# Patient Record
Sex: Female | Born: 1955 | Race: White | Hispanic: No | Marital: Married | Smoking: Never smoker
Health system: Southern US, Community
[De-identification: ages and names within clinical notes are randomized; demographics above are authoritative.]

## PROBLEM LIST (undated history)

## (undated) DIAGNOSIS — K759 Inflammatory liver disease, unspecified: Secondary | ICD-10-CM

## (undated) DIAGNOSIS — K219 Gastro-esophageal reflux disease without esophagitis: Secondary | ICD-10-CM

## (undated) HISTORY — PX: COLONOSCOPY: SHX5424

## (undated) HISTORY — PX: TONSILLECTOMY: SUR1361

---

## 2004-03-04 HISTORY — PX: LIVER BIOPSY: SHX301

## 2004-09-18 ENCOUNTER — Ambulatory Visit: Payer: Self-pay | Admitting: Internal Medicine

## 2004-12-22 ENCOUNTER — Ambulatory Visit: Payer: Self-pay | Admitting: Internal Medicine

## 2005-03-04 HISTORY — PX: OVARY SURGERY: SHX727

## 2005-10-08 ENCOUNTER — Ambulatory Visit: Payer: Self-pay | Admitting: Gastroenterology

## 2006-10-02 ENCOUNTER — Ambulatory Visit: Payer: Self-pay | Admitting: Unknown Physician Specialty

## 2006-10-09 ENCOUNTER — Ambulatory Visit: Payer: Self-pay | Admitting: Unknown Physician Specialty

## 2011-11-08 ENCOUNTER — Ambulatory Visit: Payer: Self-pay | Admitting: Internal Medicine

## 2012-11-26 ENCOUNTER — Ambulatory Visit: Payer: Self-pay | Admitting: Family Medicine

## 2013-11-05 ENCOUNTER — Emergency Department: Payer: Self-pay | Admitting: Emergency Medicine

## 2013-11-05 LAB — COMPREHENSIVE METABOLIC PANEL
ALT: 47 U/L
ANION GAP: 12 (ref 7–16)
AST: 47 U/L — AB (ref 15–37)
Albumin: 4.1 g/dL (ref 3.4–5.0)
Alkaline Phosphatase: 59 U/L
BUN: 9 mg/dL (ref 7–18)
Bilirubin,Total: 0.7 mg/dL (ref 0.2–1.0)
CALCIUM: 9.2 mg/dL (ref 8.5–10.1)
CO2: 22 mmol/L (ref 21–32)
CREATININE: 0.69 mg/dL (ref 0.60–1.30)
Chloride: 110 mmol/L — ABNORMAL HIGH (ref 98–107)
GLUCOSE: 97 mg/dL (ref 65–99)
Osmolality: 285 (ref 275–301)
POTASSIUM: 4.1 mmol/L (ref 3.5–5.1)
SODIUM: 144 mmol/L (ref 136–145)
Total Protein: 7.7 g/dL (ref 6.4–8.2)

## 2013-11-05 LAB — URINALYSIS, COMPLETE
BILIRUBIN, UR: NEGATIVE
Bacteria: NONE SEEN
Blood: NEGATIVE
Glucose,UR: NEGATIVE mg/dL (ref 0–75)
KETONE: NEGATIVE
Nitrite: NEGATIVE
Ph: 6 (ref 4.5–8.0)
Protein: NEGATIVE
SPECIFIC GRAVITY: 1.005 (ref 1.003–1.030)
WBC UR: 3 /HPF (ref 0–5)

## 2013-11-05 LAB — CBC WITH DIFFERENTIAL/PLATELET
BASOS PCT: 1 %
Basophil #: 0.1 10*3/uL (ref 0.0–0.1)
EOS ABS: 0 10*3/uL (ref 0.0–0.7)
Eosinophil %: 0.7 %
HCT: 44.9 % (ref 35.0–47.0)
HGB: 14.7 g/dL (ref 12.0–16.0)
LYMPHS ABS: 1.1 10*3/uL (ref 1.0–3.6)
Lymphocyte %: 16.8 %
MCH: 28.9 pg (ref 26.0–34.0)
MCHC: 32.7 g/dL (ref 32.0–36.0)
MCV: 89 fL (ref 80–100)
Monocyte #: 0.5 x10 3/mm (ref 0.2–0.9)
Monocyte %: 7.4 %
Neutrophil #: 4.9 10*3/uL (ref 1.4–6.5)
Neutrophil %: 74.1 %
PLATELETS: 232 10*3/uL (ref 150–440)
RBC: 5.07 10*6/uL (ref 3.80–5.20)
RDW: 13.3 % (ref 11.5–14.5)
WBC: 6.6 10*3/uL (ref 3.6–11.0)

## 2013-11-05 LAB — LIPASE, BLOOD: Lipase: 215 U/L (ref 73–393)

## 2013-11-05 LAB — TROPONIN I: Troponin-I: <0.02 ng/mL

## 2013-12-08 ENCOUNTER — Ambulatory Visit: Payer: Self-pay | Admitting: Family Medicine

## 2014-10-17 ENCOUNTER — Other Ambulatory Visit: Payer: Self-pay | Admitting: Family Medicine

## 2014-10-17 DIAGNOSIS — Z1231 Encounter for screening mammogram for malignant neoplasm of breast: Secondary | ICD-10-CM

## 2014-12-12 ENCOUNTER — Ambulatory Visit
Admission: RE | Admit: 2014-12-12 | Discharge: 2014-12-12 | Disposition: A | Discharge status: Home / Self Care | Payer: BC Managed Care – PPO | Source: Ambulatory Visit | Attending: Family Medicine | Admitting: Family Medicine

## 2014-12-12 DIAGNOSIS — Z1231 Encounter for screening mammogram for malignant neoplasm of breast: Secondary | ICD-10-CM | POA: Diagnosis present

## 2015-10-11 ENCOUNTER — Other Ambulatory Visit: Payer: Self-pay | Admitting: Family Medicine

## 2015-10-11 DIAGNOSIS — Z1231 Encounter for screening mammogram for malignant neoplasm of breast: Secondary | ICD-10-CM

## 2015-12-13 ENCOUNTER — Other Ambulatory Visit: Payer: Self-pay | Admitting: Family Medicine

## 2015-12-13 ENCOUNTER — Ambulatory Visit
Admission: RE | Admit: 2015-12-13 | Discharge: 2015-12-13 | Disposition: A | Discharge status: Home / Self Care | Payer: BC Managed Care – PPO | Source: Ambulatory Visit | Attending: Family Medicine | Admitting: Family Medicine

## 2015-12-13 DIAGNOSIS — Z1231 Encounter for screening mammogram for malignant neoplasm of breast: Secondary | ICD-10-CM | POA: Diagnosis not present

## 2015-12-13 DIAGNOSIS — R921 Mammographic calcification found on diagnostic imaging of breast: Secondary | ICD-10-CM | POA: Diagnosis not present

## 2015-12-13 DIAGNOSIS — R928 Other abnormal and inconclusive findings on diagnostic imaging of breast: Secondary | ICD-10-CM | POA: Insufficient documentation

## 2015-12-15 ENCOUNTER — Other Ambulatory Visit: Payer: Self-pay | Admitting: Family Medicine

## 2015-12-15 DIAGNOSIS — R921 Mammographic calcification found on diagnostic imaging of breast: Secondary | ICD-10-CM

## 2015-12-15 DIAGNOSIS — R928 Other abnormal and inconclusive findings on diagnostic imaging of breast: Secondary | ICD-10-CM

## 2015-12-19 ENCOUNTER — Ambulatory Visit
Admission: RE | Admit: 2015-12-19 | Discharge: 2015-12-19 | Disposition: A | Discharge status: Home / Self Care | Payer: BC Managed Care – PPO | Source: Ambulatory Visit | Attending: Family Medicine | Admitting: Family Medicine

## 2015-12-19 DIAGNOSIS — R921 Mammographic calcification found on diagnostic imaging of breast: Secondary | ICD-10-CM | POA: Diagnosis present

## 2015-12-19 DIAGNOSIS — R928 Other abnormal and inconclusive findings on diagnostic imaging of breast: Secondary | ICD-10-CM

## 2015-12-20 ENCOUNTER — Other Ambulatory Visit: Payer: Self-pay | Admitting: Family Medicine

## 2015-12-20 DIAGNOSIS — R921 Mammographic calcification found on diagnostic imaging of breast: Secondary | ICD-10-CM

## 2015-12-27 ENCOUNTER — Ambulatory Visit
Admission: RE | Admit: 2015-12-27 | Discharge: 2015-12-27 | Disposition: A | Discharge status: Home / Self Care | Payer: BC Managed Care – PPO | Source: Ambulatory Visit | Attending: Family Medicine | Admitting: Family Medicine

## 2015-12-27 DIAGNOSIS — R921 Mammographic calcification found on diagnostic imaging of breast: Secondary | ICD-10-CM | POA: Diagnosis present

## 2015-12-27 DIAGNOSIS — D241 Benign neoplasm of right breast: Secondary | ICD-10-CM | POA: Insufficient documentation

## 2015-12-27 HISTORY — PX: BREAST BIOPSY: SHX20

## 2015-12-28 LAB — SURGICAL PATHOLOGY

## 2016-01-01 ENCOUNTER — Telehealth: Payer: Self-pay | Admitting: *Deleted

## 2016-01-01 NOTE — Telephone Encounter (Signed)
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  Per radiology recommendation  Patient will need a surgical consult.  Patient scheduled to see Dr. Rochel Brome on 01/22/16 @ 4:00.  Dr. Reuel Boom office notified.

## 2016-01-05 ENCOUNTER — Other Ambulatory Visit: Payer: Self-pay | Admitting: Surgery

## 2016-01-05 DIAGNOSIS — N63 Unspecified lump in unspecified breast: Secondary | ICD-10-CM

## 2016-01-11 ENCOUNTER — Encounter
Admission: RE | Admit: 2016-01-11 | Discharge: 2016-01-11 | Disposition: A | Discharge status: Home / Self Care | Payer: BC Managed Care – PPO | Source: Ambulatory Visit | Attending: Surgery | Admitting: Surgery

## 2016-01-11 HISTORY — DX: Inflammatory liver disease, unspecified: K75.9

## 2016-01-11 HISTORY — DX: Gastro-esophageal reflux disease without esophagitis: K21.9

## 2016-01-11 NOTE — Patient Instructions (Addendum)
  Your procedure is scheduled on: 01-18-16 Report to [facility] @ 8 AM   Remember: Instructions that are not followed completely may result in serious medical risk, up to and including death, or upon the discretion of your surgeon and anesthesiologist your surgery may need to be rescheduled.    _x___ 1. Do not eat food or drink liquids after midnight. No gum chewing or hard candies.     __x__ 2. No Alcohol for 24 hours before or after surgery.   __x__3. No Smoking for 24 prior to surgery.   ____  4. Bring all medications with you on the day of surgery if instructed.    __x__ 5. Notify your doctor if there is any change in your medical condition     (cold, fever, infections).     Do not wear jewelry, make-up, hairpins, clips or nail polish.  Do not wear lotions, powders, or perfumes. You may wear deodorant.  Do not shave 48 hours prior to surgery. Men may shave face and neck.  Do not bring valuables to the hospital.    [facility] is not responsible for any belongings or valuables.               Contacts, dentures or bridgework may not be worn into surgery.  Leave your suitcase in the car. After surgery it may be brought to your room.  For patients admitted to the hospital, discharge time is determined by your treatment team.   Patients discharged the day of surgery will not be allowed to drive home.    Please read over the following fact sheets that you were given:   [facility] Preparing for Surgery and or MRSA Information   ____ Take these medicines the morning of surgery with A SIP OF WATER:    1. DO NOT TAKE CELLCEPT THE MORNING OF SURGERY PER DR SMITH  2.  3.  4.  5.  6.  ____Fleets enema or Magnesium Citrate as directed.   ____ Use CHG Soap or sage wipes as directed on instruction sheet   ____ Use inhalers on the day of surgery and bring to hospital day of surgery  ____ Stop metformin 2 days prior to surgery    ____ Take 1/2 of usual insulin dose the  night before surgery and none on the morning of   surgery.   ____ Stop aspirin or coumadin, or plavix  x__ Stop Anti-inflammatories such as Advil, Aleve, Ibuprofen, Motrin, Naproxen,          Naprosyn, Goodies powders or aspirin products. Ok to take Tylenol.   ____ Stop supplements until after surgery.    ____ Bring C-Pap to the hospital.

## 2016-01-18 ENCOUNTER — Encounter
Admission: RE | Disposition: A | Discharge status: Home / Self Care | Payer: Self-pay | Source: Ambulatory Visit | Attending: Surgery

## 2016-01-18 ENCOUNTER — Encounter: Payer: Self-pay | Admitting: *Deleted

## 2016-01-18 ENCOUNTER — Ambulatory Visit: Payer: BC Managed Care – PPO | Admitting: Anesthesiology

## 2016-01-18 ENCOUNTER — Ambulatory Visit
Admission: RE | Admit: 2016-01-18 | Discharge: 2016-01-18 | Disposition: A | Discharge status: Home / Self Care | Payer: BC Managed Care – PPO | Source: Ambulatory Visit | Attending: Surgery | Admitting: Surgery

## 2016-01-18 DIAGNOSIS — D241 Benign neoplasm of right breast: Secondary | ICD-10-CM | POA: Diagnosis not present

## 2016-01-18 DIAGNOSIS — K219 Gastro-esophageal reflux disease without esophagitis: Secondary | ICD-10-CM | POA: Diagnosis not present

## 2016-01-18 DIAGNOSIS — N63 Unspecified lump in unspecified breast: Secondary | ICD-10-CM

## 2016-01-18 DIAGNOSIS — N6310 Unspecified lump in the right breast, unspecified quadrant: Secondary | ICD-10-CM | POA: Insufficient documentation

## 2016-01-18 HISTORY — PX: BREAST EXCISIONAL BIOPSY: SUR124

## 2016-01-18 HISTORY — PX: BREAST LUMPECTOMY WITH NEEDLE LOCALIZATION: SHX5759

## 2016-01-18 SURGERY — BREAST LUMPECTOMY WITH NEEDLE LOCALIZATION
Anesthesia: General | Laterality: Right | Wound class: Clean Contaminated

## 2016-01-18 MED ORDER — ONDANSETRON HCL 4 MG/2ML IJ SOLN
INTRAMUSCULAR | Status: DC | PRN
  Administered 2016-01-18: 4 mg via INTRAVENOUS

## 2016-01-18 MED ORDER — LIDOCAINE HCL (CARDIAC) 20 MG/ML IV SOLN
INTRAVENOUS | Status: DC | PRN
  Administered 2016-01-18: 50 mg via INTRAVENOUS

## 2016-01-18 MED ORDER — HYDROCODONE-ACETAMINOPHEN 5-325 MG PO TABS: 1.0000 | ORAL_TABLET | ORAL | 0 refills | Status: AC | PRN

## 2016-01-18 MED ORDER — FAMOTIDINE 20 MG PO TABS
ORAL_TABLET | ORAL | Status: AC
  Administered 2016-01-18: 20 mg via ORAL
  Filled 2016-01-18: qty 1

## 2016-01-18 MED ORDER — HYDROCODONE-ACETAMINOPHEN 5-325 MG PO TABS
ORAL_TABLET | ORAL | Status: AC
  Filled 2016-01-18: qty 1

## 2016-01-18 MED ORDER — BUPIVACAINE-EPINEPHRINE (PF) 0.5% -1:200000 IJ SOLN
INTRAMUSCULAR | Status: AC
  Filled 2016-01-18: qty 30

## 2016-01-18 MED ORDER — MIDAZOLAM HCL 2 MG/2ML IJ SOLN
INTRAMUSCULAR | Status: DC | PRN
Start: 2016-01-18 — End: 2016-01-18
  Administered 2016-01-18: 2 mg via INTRAVENOUS

## 2016-01-18 MED ORDER — ONDANSETRON HCL 4 MG/2ML IJ SOLN: 4.0000 mg | Freq: Once | INTRAMUSCULAR | Status: DC | PRN

## 2016-01-18 MED ORDER — HYDROCODONE-ACETAMINOPHEN 5-325 MG PO TABS
1.0000 | ORAL_TABLET | ORAL | Status: DC | PRN
  Administered 2016-01-18: 1 via ORAL

## 2016-01-18 MED ORDER — PROPOFOL 10 MG/ML IV BOLUS
INTRAVENOUS | Status: DC | PRN
  Administered 2016-01-18: 200 mg via INTRAVENOUS

## 2016-01-18 MED ORDER — FENTANYL CITRATE (PF) 100 MCG/2ML IJ SOLN: 25.0000 ug | INTRAMUSCULAR | Status: DC | PRN

## 2016-01-18 MED ORDER — FAMOTIDINE 20 MG PO TABS
20.0000 mg | ORAL_TABLET | Freq: Once | ORAL | Status: AC
  Administered 2016-01-18: 20 mg via ORAL

## 2016-01-18 MED ORDER — LACTATED RINGERS IV SOLN
INTRAVENOUS | Status: DC
  Administered 2016-01-18: 10:00:00 via INTRAVENOUS

## 2016-01-18 MED ORDER — GLYCOPYRROLATE 0.2 MG/ML IJ SOLN
INTRAMUSCULAR | Status: DC | PRN
  Administered 2016-01-18: 0.2 mg via INTRAVENOUS

## 2016-01-18 MED ORDER — FENTANYL CITRATE (PF) 100 MCG/2ML IJ SOLN
INTRAMUSCULAR | Status: DC | PRN
  Administered 2016-01-18 (×2): 50 ug via INTRAVENOUS

## 2016-01-18 MED ORDER — EPHEDRINE SULFATE 50 MG/ML IJ SOLN
INTRAMUSCULAR | Status: DC | PRN
  Administered 2016-01-18: 15 mg via INTRAVENOUS

## 2016-01-18 MED ORDER — DEXAMETHASONE SODIUM PHOSPHATE 10 MG/ML IJ SOLN
INTRAMUSCULAR | Status: DC | PRN
  Administered 2016-01-18: 4 mg via INTRAVENOUS

## 2016-01-18 MED ORDER — BUPIVACAINE-EPINEPHRINE (PF) 0.5% -1:200000 IJ SOLN
INTRAMUSCULAR | Status: DC | PRN
  Administered 2016-01-18: 10 mL via PERINEURAL

## 2016-01-18 MED ORDER — LACTATED RINGERS IV SOLN
INTRAVENOUS | Status: DC | PRN
  Administered 2016-01-18: 10:00:00 via INTRAVENOUS

## 2016-01-18 SURGICAL SUPPLY — 28 items
BLADE SURG 15 STRL LF DISP TIS (BLADE) ×1 IMPLANT
BLADE SURG 15 STRL SS (BLADE) ×2
CANISTER SUCT 1200ML W/VALVE (MISCELLANEOUS) ×3 IMPLANT
CHLORAPREP W/TINT 26ML (MISCELLANEOUS) ×3 IMPLANT
CNTNR SPEC 2.5X3XGRAD LEK (MISCELLANEOUS) ×2
CONT SPEC 4OZ STER OR WHT (MISCELLANEOUS) ×4
CONTAINER SPEC 2.5X3XGRAD LEK (MISCELLANEOUS) ×2 IMPLANT
COVER LIGHT HANDLE STERIS (MISCELLANEOUS) ×3 IMPLANT
DRAPE LAPAROTOMY TRNSV 106X77 (MISCELLANEOUS) ×3 IMPLANT
ELECT REM PT RETURN 9FT ADLT (ELECTROSURGICAL) ×3
ELECTRODE REM PT RTRN 9FT ADLT (ELECTROSURGICAL) ×1 IMPLANT
GLOVE BIO SURGEON STRL SZ7.5 (GLOVE) ×3 IMPLANT
GOWN STRL REUS W/ TWL LRG LVL3 (GOWN DISPOSABLE) ×3 IMPLANT
GOWN STRL REUS W/TWL LRG LVL3 (GOWN DISPOSABLE) ×6
KIT RM TURNOVER STRD PROC AR (KITS) ×3 IMPLANT
LABEL OR SOLS (LABEL) ×3 IMPLANT
LIQUID BAND (GAUZE/BANDAGES/DRESSINGS) ×3 IMPLANT
MARGIN MAP 10MM (MISCELLANEOUS) ×3 IMPLANT
NEEDLE HYPO 25X1 1.5 SAFETY (NEEDLE) ×3 IMPLANT
PACK BASIN MINOR ARMC (MISCELLANEOUS) ×3 IMPLANT
SLEVE PROBE SENORX GAMMA FIND (MISCELLANEOUS) ×3 IMPLANT
SPONGE LAP 18X18 5 PK (GAUZE/BANDAGES/DRESSINGS) ×3 IMPLANT
SUT CHROMIC 4 0 RB 1X27 (SUTURE) ×3 IMPLANT
SUT ETHILON 3-0 FS-10 30 BLK (SUTURE) ×3
SUT MNCRL AB 4-0 PS2 18 (SUTURE) IMPLANT
SUTURE EHLN 3-0 FS-10 30 BLK (SUTURE) ×1 IMPLANT
SYRINGE 10CC LL (SYRINGE) ×3 IMPLANT
WATER STERILE IRR 1000ML POUR (IV SOLUTION) ×3 IMPLANT

## 2016-01-18 NOTE — Transfer of Care (Signed)
Immediate Anesthesia Transfer of Care Note  Patient: Rebecca Griffith  Procedure(s) Performed: Procedure(s): BREAST LUMPECTOMY WITH NEEDLE LOCALIZATION (Right)  Patient Location: PACU  Anesthesia Type:General  Level of Consciousness: awake, alert  and oriented  Airway & Oxygen Therapy: Patient Spontanous Breathing and Patient connected to nasal cannula oxygen  Post-op Assessment: Report given to RN and Post -op Vital signs reviewed and stable  Post vital signs: Reviewed and stable  Last Vitals:  Vitals:   01/18/16 0856 01/18/16 1146  BP: 138/62 124/76  Pulse: 72 100  Resp: 16 (!) 9  Temp: 36.8 C 36.3 C    Last Pain:  Vitals:   01/18/16 1146  TempSrc:   PainSc: 0-No pain         Complications: No apparent anesthesia complications

## 2016-01-18 NOTE — Anesthesia Postprocedure Evaluation (Signed)
Anesthesia Post Note  Patient: Rebecca Griffith  Procedure(s) Performed: Procedure(s) (LRB): BREAST LUMPECTOMY WITH NEEDLE LOCALIZATION (Right)  Patient location during evaluation: PACU Anesthesia Type: General Level of consciousness: awake and alert and oriented Pain management: pain level controlled Vital Signs Assessment: post-procedure vital signs reviewed and stable Respiratory status: spontaneous breathing Cardiovascular status: blood pressure returned to baseline Anesthetic complications: no    Last Vitals:  Vitals:   01/18/16 1236 01/18/16 1240  BP: 138/77 129/73  Pulse: 76   Resp: 16   Temp: 37 C     Last Pain:  Vitals:   01/18/16 1146  TempSrc:   PainSc: 0-No pain                 Thomson Herbers

## 2016-01-18 NOTE — Discharge Instructions (Signed)
AMBULATORY SURGERY  DISCHARGE INSTRUCTIONS   1) The drugs that you were given will stay in your system until tomorrow so for the next 24 hours you should not:  A) Drive an automobile B) Make any legal decisions C) Drink any alcoholic beverage   2) You may resume regular meals tomorrow.  Today it is better to start with liquids and gradually work up to solid foods.  You may eat anything you prefer, but it is better to start with liquids, then soup and crackers, and gradually work up to solid foods.   3) Please notify your doctor immediately if you have any unusual bleeding, trouble breathing, redness and pain at the surgery site, drainage, fever, or pain not relieved by medication.    4) Take Tylenol or Norco if needed for pain. 5) Should not drive or do anything dangerous when taking Norco. 6) May resume mycophenolate on Sunday. 7) Wear bra as desired for comfort and support. 8) May shower and blot dry.        Please contact your physician with any problems or Same Day Surgery at [phone], Monday through Friday 6 am to 4 pm, or [place] at [facility] number at [phone].

## 2016-01-18 NOTE — Op Note (Signed)
OPERATIVE REPORT  PREOPERATIVE  DIAGNOSIS: . Right breast mass  POSTOPERATIVE DIAGNOSIS: . Right breast mass  PROCEDURE: . Excision right breast mass  ANESTHESIA:  General  SURGEON: Rochel Brome  MD   INDICATIONS: . She had mammograms depicting a 4 mm cluster of microcalcifications at the 9:00 position retroareolar portion of the right breast. Needle biopsy demonstrated intraductal papilloma. Excision was recommended for definitive treatment.The patient did have preoperative insertion of a Kopan's wire  The patient was placed on the operating table in the supine position under general anesthesia. The dressing was removed from the right breast exposing the Kopan's wire which was cut 2 cm from the skin. Ultrasound was used to image the biopsy marker in biopsy cavity. The breast was prepared with ChloraPrep and draped in a sterile manner.  A curvilinear incision was made from the 8:00 to 10:00 position at the border of the areola and carried down through subcutaneous tissues to encounter the wire. A portion of tissue approximately 2.5 x 2.5 x 2.5 cm surrounding the wire was removed. Specimen mammogram demonstrated the biopsy marker within the specimen. Specimen was submitted for routine pathology.  The wound was inspected and several small bleeding points were cauterized. Subcutaneous tissues were infiltrated with half percent Sensorcaine with epinephrine. The wound was closed with running 4-0 Monocryl subcuticular suture and Dermabond.  The patient tolerated surgery satisfactorily and was then prepared for transfer to the recovery room  [facility].D.

## 2016-01-18 NOTE — Anesthesia Procedure Notes (Signed)
Procedure Name: LMA Insertion Date/Time: 01/18/2016 10:27 AM Performed by: Darlyne Russian Pre-anesthesia Checklist: Patient identified, Emergency Drugs available, Suction available, Patient being monitored and Timeout performed Patient Re-evaluated:Patient Re-evaluated prior to inductionOxygen Delivery Method: Circle system utilized Preoxygenation: Pre-oxygenation with 100% oxygen Intubation Type: IV induction Ventilation: Mask ventilation without difficulty LMA: LMA inserted LMA Size: 4.0 Number of attempts: 1 Placement Confirmation: positive ETCO2 and breath sounds checked- equal and bilateral Dental Injury: Teeth and Oropharynx as per pre-operative assessment

## 2016-01-18 NOTE — Anesthesia Preprocedure Evaluation (Signed)
Anesthesia Evaluation  Patient identified by MRN, date of birth, ID band Patient awake    Reviewed: Allergy & Precautions, NPO status , Patient's Chart, lab work & pertinent test results  Airway Mallampati: III  TM Distance: <3 FB     Dental  (+) Chipped   Pulmonary neg pulmonary ROS,    Pulmonary exam normal        Cardiovascular negative cardio ROS Normal cardiovascular exam     Neuro/Psych negative neurological ROS  negative psych ROS   GI/Hepatic GERD  Controlled,(+) Hepatitis -, Autoimmune  Endo/Other  negative endocrine ROS  Renal/GU negative Renal ROS  negative genitourinary   Musculoskeletal negative musculoskeletal ROS (+)   Abdominal Normal abdominal exam  (+)   Peds negative pediatric ROS (+)  Hematology negative hematology ROS (+)   Anesthesia Other Findings   Reproductive/Obstetrics                             Anesthesia Physical Anesthesia Plan  ASA: III  Anesthesia Plan: General   Post-op Pain Management:    Induction: Intravenous  Airway Management Planned: LMA  Additional Equipment:   Intra-op Plan:   Post-operative Plan: Extubation in OR  Informed Consent: I have reviewed the patients History and Physical, chart, labs and discussed the procedure including the risks, benefits and alternatives for the proposed anesthesia with the patient or authorized representative who has indicated his/her understanding and acceptance.   Dental advisory given  Plan Discussed with: CRNA and Surgeon  Anesthesia Plan Comments:         Anesthesia Quick Evaluation

## 2016-01-18 NOTE — H&P (Signed)
  She reports no change in overall condition since the day of the office exam. She has had insertion of Kopan's wire to mark the biopsy site.  I discussed the plan for excision of right breast mass. The right side was marked YES

## 2016-01-19 ENCOUNTER — Encounter: Payer: Self-pay | Admitting: Surgery

## 2016-01-22 LAB — SURGICAL PATHOLOGY

## 2016-05-20 ENCOUNTER — Other Ambulatory Visit: Payer: Self-pay | Admitting: Surgery

## 2016-05-20 ENCOUNTER — Other Ambulatory Visit: Payer: Self-pay | Admitting: Internal Medicine

## 2016-05-20 DIAGNOSIS — R599 Enlarged lymph nodes, unspecified: Secondary | ICD-10-CM

## 2016-06-19 ENCOUNTER — Ambulatory Visit
Admission: RE | Admit: 2016-06-19 | Discharge: 2016-06-19 | Disposition: A | Discharge status: Home / Self Care | Payer: BC Managed Care – PPO | Source: Ambulatory Visit | Attending: Surgery | Admitting: Surgery

## 2016-06-19 DIAGNOSIS — R599 Enlarged lymph nodes, unspecified: Secondary | ICD-10-CM | POA: Insufficient documentation

## 2016-09-13 ENCOUNTER — Other Ambulatory Visit: Payer: Self-pay | Admitting: Surgery

## 2016-09-13 ENCOUNTER — Other Ambulatory Visit: Payer: Self-pay | Admitting: Family Medicine

## 2016-09-13 DIAGNOSIS — Z1231 Encounter for screening mammogram for malignant neoplasm of breast: Secondary | ICD-10-CM

## 2016-12-13 ENCOUNTER — Ambulatory Visit
Admission: RE | Admit: 2016-12-13 | Discharge: 2016-12-13 | Disposition: A | Discharge status: Home / Self Care | Payer: BC Managed Care – PPO | Source: Ambulatory Visit | Attending: Family Medicine | Admitting: Family Medicine

## 2016-12-13 DIAGNOSIS — Z1231 Encounter for screening mammogram for malignant neoplasm of breast: Secondary | ICD-10-CM | POA: Insufficient documentation

## 2017-09-12 ENCOUNTER — Other Ambulatory Visit: Payer: Self-pay | Admitting: Family Medicine

## 2017-09-12 DIAGNOSIS — Z1231 Encounter for screening mammogram for malignant neoplasm of breast: Secondary | ICD-10-CM

## 2017-12-15 ENCOUNTER — Ambulatory Visit
Admission: RE | Admit: 2017-12-15 | Discharge: 2017-12-15 | Disposition: A | Discharge status: Home / Self Care | Payer: BC Managed Care – PPO | Source: Ambulatory Visit | Attending: Family Medicine | Admitting: Family Medicine

## 2017-12-15 DIAGNOSIS — Z1231 Encounter for screening mammogram for malignant neoplasm of breast: Secondary | ICD-10-CM | POA: Diagnosis not present

## 2018-10-13 ENCOUNTER — Other Ambulatory Visit: Payer: Self-pay | Admitting: Family Medicine

## 2018-10-13 DIAGNOSIS — Z1231 Encounter for screening mammogram for malignant neoplasm of breast: Secondary | ICD-10-CM

## 2018-12-17 ENCOUNTER — Other Ambulatory Visit: Payer: Self-pay

## 2018-12-17 ENCOUNTER — Ambulatory Visit
Admission: RE | Admit: 2018-12-17 | Discharge: 2018-12-17 | Disposition: A | Discharge status: Home / Self Care | Payer: BC Managed Care – PPO | Source: Ambulatory Visit | Attending: Family Medicine | Admitting: Family Medicine

## 2018-12-17 DIAGNOSIS — Z20822 Contact with and (suspected) exposure to covid-19: Secondary | ICD-10-CM

## 2018-12-17 DIAGNOSIS — Z1231 Encounter for screening mammogram for malignant neoplasm of breast: Secondary | ICD-10-CM | POA: Diagnosis present

## 2018-12-19 LAB — NOVEL CORONAVIRUS, NAA: SARS-CoV-2, NAA: NOT DETECTED

## 2019-10-18 ENCOUNTER — Other Ambulatory Visit: Payer: Self-pay | Admitting: Family Medicine

## 2019-10-18 DIAGNOSIS — Z1231 Encounter for screening mammogram for malignant neoplasm of breast: Secondary | ICD-10-CM

## 2019-11-15 ENCOUNTER — Other Ambulatory Visit: Payer: Self-pay

## 2019-11-15 ENCOUNTER — Ambulatory Visit: Payer: BC Managed Care – PPO | Admitting: Dermatology

## 2019-11-15 ENCOUNTER — Encounter: Payer: Self-pay | Admitting: Dermatology

## 2019-11-15 DIAGNOSIS — D1800 Hemangioma unspecified site: Secondary | ICD-10-CM

## 2019-11-15 DIAGNOSIS — L814 Other melanin hyperpigmentation: Secondary | ICD-10-CM

## 2019-11-15 DIAGNOSIS — L578 Other skin changes due to chronic exposure to nonionizing radiation: Secondary | ICD-10-CM

## 2019-11-15 DIAGNOSIS — Z1283 Encounter for screening for malignant neoplasm of skin: Secondary | ICD-10-CM

## 2019-11-15 DIAGNOSIS — L603 Nail dystrophy: Secondary | ICD-10-CM | POA: Diagnosis not present

## 2019-11-15 DIAGNOSIS — L918 Other hypertrophic disorders of the skin: Secondary | ICD-10-CM

## 2019-11-15 DIAGNOSIS — L309 Dermatitis, unspecified: Secondary | ICD-10-CM | POA: Diagnosis not present

## 2019-11-15 DIAGNOSIS — D229 Melanocytic nevi, unspecified: Secondary | ICD-10-CM

## 2019-11-15 DIAGNOSIS — L821 Other seborrheic keratosis: Secondary | ICD-10-CM

## 2019-11-15 MED ORDER — EUCRISA 2 % EX OINT: TOPICAL_OINTMENT | CUTANEOUS | 11 refills | Status: DC

## 2019-11-15 NOTE — Progress Notes (Signed)
   Follow-Up Visit   Subjective  Rebecca Griffith is a 64 y.o. female who presents for the following: TBSE and Eczema. Patient presents today for TBSE. Patient has a few areas of concern under left breast. Patient does no have h/o skin cancer. The patient presents for Total-Body Skin Exam (TBSE) for skin cancer screening and mole check.  The following portions of the chart were reviewed this encounter and updated as appropriate:  Tobacco  Allergies  Meds  Problems  Med Hx  Surg Hx  Fam Hx     Review of Systems:  No other skin or systemic complaints except as noted in HPI or Assessment and Plan.  Objective  Well appearing patient in no apparent distress; mood and affect are within normal limits.  A full examination was performed including scalp, head, eyes, ears, nose, lips, neck, chest, axillae, abdomen, back, buttocks, bilateral upper extremities, bilateral lower extremities, hands, feet, fingers, toes, fingernails, and toenails. All findings within normal limits unless otherwise noted below.  Objective  B/L Interdigital space, palm, and fingers: Pinkness and scale  Objective  Right 2nd Toe Nail Plate: With symptoms  Objective  Left Abdomen (side) - Upper: Fleshy, skin-colored pedunculated papules.     Assessment & Plan  Eczema / Hand Dermatitis / Atopic Dermatitis of hands - persistent, but meds help B/L Interdigital space, palm, and fingers Continue Eucrisa apply twice daily  and to use gloves when working in kitchen Triamcinolone prn for worse flares only. CeraVe Cream moisturizer. Gloves when doing work with hands.  Nail dystrophy Right 2nd Toe Nail Plate With symptoms Patient will see Podiatry - already scheduled appt  Acrochordon - symptomatic Left Abdomen (side) - Upper Discussed snip removal procedure, patient defers at this time.   Lentigines - Scattered tan macules - Discussed due to sun exposure - Benign, observe - Call for any changes  Seborrheic  Keratoses - Stuck-on, waxy, tan-brown papules and plaques  - Discussed benign etiology and prognosis. - Observe - Call for any changes  Melanocytic Nevi - Tan-brown and/or pink-flesh-colored symmetric macules and papules - Benign appearing on exam today - Observation - Call clinic for new or changing moles - Recommend daily use of broad spectrum spf 30+ sunscreen to sun-exposed areas.   Hemangiomas - Red papules - Discussed benign nature - Observe - Call for any changes  Actinic Damage - diffuse scaly erythematous macules with underlying dyspigmentation - Recommend daily broad spectrum sunscreen SPF 30+ to sun-exposed areas, reapply every 2 hours as needed.  - Call for new or changing lesions.  Acrochordons (Skin Tags) - Fleshy, skin-colored pedunculated papules - Benign appearing.  - Observe. - If desired, they can be removed with an in office procedure that is not covered by insurance. - Please call the clinic if you notice any new or changing lesions.  Skin cancer screening performed today.  Return in about 1 year (around 11/14/2020) for TBSE.  I, Donzetta Kohut, CMA, am acting as scribe for [name], MD . Documentation: I have reviewed the above documentation for accuracy and completeness, and I agree with the above.  [name], MD

## 2019-11-15 NOTE — Patient Instructions (Addendum)
Recommend daily broad spectrum sunscreen SPF 30+ to sun-exposed areas, reapply every 2 hours as needed. Call for new or changing lesions. Atopic Dermatitis  "Dermatitis" means inflammation of the skin.  "Atopic" dermatitis is a particular type of skin inflammation that is marked by dryness, associated itching, and a characteristic pattern of rash on the body.  The condition is fairly common and may occur in as many as 10% of children.  You will often hear it called "atopic eczema" or sometimes just "eczema".  The exact cause of atopic dermatitis is unknown.  In many patients, there is a family history of hay fever, asthma, or atopic dermatitis itself.  Rarely, atopic dermatitis in infants may be related to food sensitivity, such as sensitivity to milk, but this is often difficult to determine and manage.  In the majority of cases, however, no allergic triggers can be found.  Physical or emotional stressors (severe seasonal allergies, physical illness, etc.) can worsen atopic dermatitis.  Atopic dermatitis usually starts in infancy from the ages of 2 to 6 months.  The skin is dry and the rash is quite itchy, so infants may be restless and rub against the sheets or scratch (if able).  The rash may involve the face or it may cover a large part of the body.  As the child gets older, the rash may become more localized.  In early childhood, the rash is commonly on the legs, feet, hands and arms.  As a child becomes older, the rash may be limited to the bend of the elbows, knees, on the back of the hands, feet, and on the neck and face.  When the rash becomes more established, the dry itchy skin may become thickened, leathery and sometimes darker in coloration.  The more the person scratches, the worse the rash is and the thicker the skin gets.  Many children with atopic dermatitis outgrow the condition before school age, while others continue to have problems into adolescence and adulthood.  Many things may  affect the severity of the condition.  All patients have sensitive and dry skin.  Many will find that during the winter months when the humidity is very low, the dryness and itchiness will be worse.  On the other hand, some people are easily irritated by sweat and will find that they have more problems during the summer months.  Most patients note an increase in itching at times when there are sudden changes in temperature.  Other irritants easily affect the skin of a patient with atopic dermatitis.  Use of harsh soaps or detergents and exposure to wool are common problems.  Sometimes atopic dermatitis may become infected by bacteria, yeast or viruses.  This is called "secondary infection".  Bacterial secondary infection is the most common and is often a result of scratching.  The rash gets very red with pus-filled pimples and scabs.  If this occurs, your doctor will prescribe an antibiotic to control the infection.  A more serious complication can be caused by certain viruses.  The "cold sore" virus (herpes simplex) may cause a severe rash.  If this is suspected, immediately contact your doctor.   What can I expect from treatment? Unfortunately, there is no "magic" cure that will always eliminate atopic dermatitis.  The main objective in treating atopic dermatitis is to decrease the skin eruption and relieve the itching.  There are a number of different forms of the medications that are used for atopic dermatitis.  Primarily, topical medications will be  used.  Because the skin is excessively dry, moisturizers will be recommended that will effectively decrease the dryness.  Daily bathing is a useful way to get water into the skin but bathing should be brief (no more than 10 minutes unless otherwise indicated by your physician).  Effective moisturizers (Cetaphil cream or lotion, CeraVe cream or lotion [[employer], [employer], and [employer]], Aquaphor, and plain Vaseline) can be used immediately after the bath or shower  to trap moisture within the skin.  It is best to "pat dry" after a bathing and then place your moisturizer (cream or lotion) on your skin.  Cortisone (steroid) is a medicated ointment or cream (eg. triamcinolone, hydrocortisone, desonide, betamethasone, clobetasol) that may also be suggested.  It is very helpful in decreasing the itching and controlling the inflammation.  Your doctor will prescribe a cortisone treatment that is most appropriate for the severity and location of the dermatitis that is to be treated.   Once the affected area clears up, it is best to discontinue the use of the cortisone preparation due to possibility of atrophy (skin thinning), but continue the regular use of moisturizers to try to prevent new areas of dermatitis from occurring.  Of course, if itching or a new rash begins, the cortisone preparation may have to be started again.  Anti-inflammatory creams and ointments which are not steroids such as Protopic and Elidel may also be prescribed.  Certain internal medicines called antihistamines (eg. Atarax, Benadryl, hydroxyzine) may help control itching.  They primarily help with the itching by introducing some drowsiness and allowing you to sleep at night.  Some oral antibiotics are often useful as well for controlling the secondary infection and enable infected dermatitis to be controlled.  Other important forms of treatment: 1. Avoid contact with substances you know to cause itching.  These may include soaps, detergents, certain perfumes, dust, grass, weeds, wools, and other types of scratchy clothing. 2. You may bathe daily.  Use no soap or the minimal amount necessary to get clean.  Always use moisturizer immediately after bathing (within 3 minutes is best).  Avoid very hot or very cold water.  Avoid bubble baths.  When drying with a towel, pat dry and do not rub. Use a mild, unscented soap (Dove, CeraVe Cleanser, Lever 2000, or Cetaphil). 3. Try to keep the temperature and  humidity in the home fairly constant.  Use a bedroom air conditioner in the summer and a humidifier in the winter.  It is very important that the humidifier be cleaned frequently and thoroughly since mold may grow and cause allergies. 4. Try to avoid scratching.  Atopic dermatitis is often called "the itch that rashes" and it is known that scratching plays a significant role in making atopic dermatitis worse.  Keeping the nails short and well-filed is helpful. 5. Use a fragrance-free, sensitive skin laundry detergent (eg. All Free & Clear).  Run clothes through a second rinse cycle to remove any residual detergents and chemicals.  Bed linens and towels should be washed in hot water to kill dust mites, which are common allergen in atopic patients. 6. In the bedroom, minimize rugs and curtains or other loose fabrics that collect dust.  The National Eczema Association (www.eczema-assn.org) is a wonderful organization that sends out a Mudlogger with useful information on these types of conditions. Please consider contacting them at the above website or by address: National Eczema [employer] for Science and Education, [place], [place], [place], [place]

## 2019-12-20 ENCOUNTER — Ambulatory Visit
Admission: RE | Admit: 2019-12-20 | Discharge: 2019-12-20 | Disposition: A | Discharge status: Home / Self Care | Payer: BC Managed Care – PPO | Source: Ambulatory Visit | Attending: Family Medicine | Admitting: Family Medicine

## 2019-12-20 ENCOUNTER — Other Ambulatory Visit: Payer: Self-pay

## 2019-12-20 DIAGNOSIS — Z1231 Encounter for screening mammogram for malignant neoplasm of breast: Secondary | ICD-10-CM | POA: Diagnosis not present

## 2020-10-19 ENCOUNTER — Other Ambulatory Visit: Payer: Self-pay | Admitting: Family Medicine

## 2020-10-19 DIAGNOSIS — Z1231 Encounter for screening mammogram for malignant neoplasm of breast: Secondary | ICD-10-CM

## 2020-10-24 ENCOUNTER — Other Ambulatory Visit: Payer: Self-pay

## 2020-10-24 MED ORDER — EUCRISA 2 % EX OINT
TOPICAL_OINTMENT | CUTANEOUS | 0 refills | Status: DC
Start: 2020-10-24 — End: 2020-11-15

## 2020-11-15 ENCOUNTER — Ambulatory Visit: Payer: Medicare PPO | Admitting: Dermatology

## 2020-11-15 ENCOUNTER — Other Ambulatory Visit: Payer: Self-pay

## 2020-11-15 DIAGNOSIS — L821 Other seborrheic keratosis: Secondary | ICD-10-CM

## 2020-11-15 DIAGNOSIS — L309 Dermatitis, unspecified: Secondary | ICD-10-CM | POA: Diagnosis not present

## 2020-11-15 DIAGNOSIS — L578 Other skin changes due to chronic exposure to nonionizing radiation: Secondary | ICD-10-CM

## 2020-11-15 DIAGNOSIS — R238 Other skin changes: Secondary | ICD-10-CM | POA: Diagnosis not present

## 2020-11-15 DIAGNOSIS — L918 Other hypertrophic disorders of the skin: Secondary | ICD-10-CM

## 2020-11-15 DIAGNOSIS — Z1283 Encounter for screening for malignant neoplasm of skin: Secondary | ICD-10-CM

## 2020-11-15 DIAGNOSIS — D1801 Hemangioma of skin and subcutaneous tissue: Secondary | ICD-10-CM

## 2020-11-15 DIAGNOSIS — D229 Melanocytic nevi, unspecified: Secondary | ICD-10-CM

## 2020-11-15 DIAGNOSIS — D1800 Hemangioma unspecified site: Secondary | ICD-10-CM

## 2020-11-15 DIAGNOSIS — L814 Other melanin hyperpigmentation: Secondary | ICD-10-CM

## 2020-11-15 MED ORDER — EUCRISA 2 % EX OINT: 1.0000 "application " | TOPICAL_OINTMENT | CUTANEOUS | 4 refills | Status: AC

## 2020-11-15 NOTE — Progress Notes (Signed)
Follow-Up Visit   Subjective  Rebecca Griffith is a 65 y.o. female who presents for the following: Total body skin exam (No hx of skin ca) and Dermatitis (Bil hands, Eucrisa oint prn ). The patient presents for Total-Body Skin Exam (TBSE) for skin cancer screening and mole check.   The following portions of the chart were reviewed this encounter and updated as appropriate:   Tobacco  Allergies  Meds  Problems  Med Hx  Surg Hx  Fam Hx     Review of Systems:  No other skin or systemic complaints except as noted in HPI or Assessment and Plan.  Objective  Well appearing patient in no apparent distress; mood and affect are within normal limits.  A full examination was performed including scalp, head, eyes, ears, nose, lips, neck, chest, axillae, abdomen, back, buttocks, bilateral upper extremities, bilateral lower extremities, hands, feet, fingers, toes, fingernails, and toenails. All findings within normal limits unless otherwise noted below.  bil hands L hand mostly clear, right hand with pinkness and peeling  Right ear helix Dilated blood vessel  L chest Dark purple macule, without features suspicious for malignancy on dermoscopy    Assessment & Plan   Lentigines - Scattered tan macules - Due to sun exposure - Benign-appearing, observe - Recommend daily broad spectrum sunscreen SPF 30+ to sun-exposed areas, reapply every 2 hours as needed. - Call for any changes  Seborrheic Keratoses - Stuck-on, waxy, tan-brown papules and/or plaques  - Benign-appearing - Discussed benign etiology and prognosis. - Observe - Call for any changes  Melanocytic Nevi - Tan-brown and/or pink-flesh-colored symmetric macules and papules - Benign appearing on exam today - Observation - Call clinic for new or changing moles - Recommend daily use of broad spectrum spf 30+ sunscreen to sun-exposed areas.   Hemangiomas - Red papules - Discussed benign nature - Observe - Call for any  changes  Actinic Damage - Chronic condition, secondary to cumulative UV/sun exposure - diffuse scaly erythematous macules with underlying dyspigmentation - Recommend daily broad spectrum sunscreen SPF 30+ to sun-exposed areas, reapply every 2 hours as needed.  - Staying in the shade or wearing long sleeves, sun glasses (UVA+UVB protection) and wide brim hats (4-inch brim around the entire circumference of the hat) are also recommended for sun protection.  - Call for new or changing lesions.  Acrochordons (Skin Tags) - Fleshy, skin-colored pedunculated papules - Benign appearing.  - Observe. - If desired, they can be removed with an in office procedure that is not covered by insurance. - Please call the clinic if you notice any new or changing lesions.   Skin cancer screening performed today.  Hand dermatitis bil hands  Flare exacerbated by yardwork  Atopic dermatitis (eczema) is a chronic, relapsing, pruritic condition that can significantly affect quality of life. It is often associated with allergic rhinitis and/or asthma and can require treatment with topical medications, phototherapy, or in severe cases a biologic medication called Dupixent in children and adults.    Cont Eucrisa oint qd/bid aa hands prn flares, dsp 180g (30msupply) 4rfs Cont TMC 0.1% cr qd up to 5d/wk prn more severe flares  Topical steroids (such as triamcinolone, fluocinolone, fluocinonide, mometasone, clobetasol, halobetasol, betamethasone, hydrocortisone) can cause thinning and lightening of the skin if they are used for too long in the same area. Your physician has selected the right strength medicine for your problem and area affected on the body. Please use your medication only as directed by your  physician to prevent side effects.     Crisaborole (EUCRISA) 2 % OINT - bil hands Apply 1 application topically as directed. Qd to bid aa hands prn flares  Venous lake Right ear helix Benign-appearing.   Observation.  Call clinic for new or changing lesions.  Recommend daily use of broad spectrum spf 30+ sunscreen to sun-exposed areas.   Hemangioma of skin L chest Benign, observe  Skin cancer screening  Return in about 1 year (around 11/15/2021) for TBSE, hand derm f/u.  I, Rebecca Griffith, RMA, am acting as scribe for [name], MD . Documentation: I have reviewed the above documentation for accuracy and completeness, and I agree with the above.  [name], MD

## 2020-11-15 NOTE — Patient Instructions (Signed)

## 2020-11-18 ENCOUNTER — Encounter: Payer: Self-pay | Admitting: Dermatology

## 2020-11-20 ENCOUNTER — Other Ambulatory Visit: Payer: Self-pay | Admitting: Dermatology

## 2020-11-20 DIAGNOSIS — L309 Dermatitis, unspecified: Secondary | ICD-10-CM

## 2020-12-20 ENCOUNTER — Other Ambulatory Visit: Payer: Self-pay

## 2020-12-20 ENCOUNTER — Ambulatory Visit
Admission: RE | Admit: 2020-12-20 | Discharge: 2020-12-20 | Disposition: A | Discharge status: Home / Self Care | Payer: Medicare PPO | Source: Ambulatory Visit | Attending: Family Medicine | Admitting: Family Medicine

## 2020-12-20 DIAGNOSIS — Z1231 Encounter for screening mammogram for malignant neoplasm of breast: Secondary | ICD-10-CM | POA: Insufficient documentation

## 2021-04-26 ENCOUNTER — Other Ambulatory Visit: Payer: Self-pay

## 2021-04-26 ENCOUNTER — Ambulatory Visit: Payer: Medicare PPO | Admitting: Dermatology

## 2021-04-26 DIAGNOSIS — L820 Inflamed seborrheic keratosis: Secondary | ICD-10-CM

## 2021-04-26 NOTE — Progress Notes (Signed)
° °  Follow-Up Visit   Subjective  CHIRSTINE Griffith is a 66 y.o. female who presents for the following: Skin Problem (Patient here today with a spot above left eyebrow. Patient thinks it may be larger and darker. It does get irritated and patient would like removed. ).  Patient normally sees Dr. Nehemiah Massed for annual exam.   The following portions of the chart were reviewed this encounter and updated as appropriate:   Tobacco   Allergies   Meds   Problems   Med Hx   Surg Hx   Fam Hx       Review of Systems:  No other skin or systemic complaints except as noted in HPI or Assessment and Plan.  Objective  Well appearing patient in no apparent distress; mood and affect are within normal limits.  A focused examination was performed including face. Relevant physical exam findings are noted in the Assessment and Plan.  right forehead Erythematous stuck-on, waxy papule or plaque    Assessment & Plan  Inflamed seborrheic keratosis right forehead  Prior to procedure, discussed risks of blister formation, small wound, skin dyspigmentation, or rare scar following cryotherapy. Recommend Vaseline ointment to treated areas while healing.  Symptomatic   Destruction of lesion - right forehead  Destruction method: cryotherapy   Informed consent: discussed and consent obtained   Lesion destroyed using liquid nitrogen: Yes   Cryotherapy cycles:  2 Outcome: patient tolerated procedure well with no complications   Post-procedure details: wound care instructions given     Return for as scheduled.  Graciella Belton, RMA, am acting as scribe for Forest Gleason, MD .  Documentation: I have reviewed the above documentation for accuracy and completeness, and I agree with the above.  Forest Gleason, MD

## 2021-04-26 NOTE — Patient Instructions (Addendum)
Cryotherapy Aftercare  Wash gently with soap and water everyday.   Apply Vaseline and Band-Aid daily until healed.   Recommend taking Heliocare sun protection supplement daily in sunny weather for additional sun protection. For maximum protection on the sunniest days, you can take up to 2 capsules of regular Heliocare OR take 1 capsule of Heliocare Ultra. For prolonged exposure (such as a full day in the sun), you can repeat your dose of the supplement 4 hours after your first dose. Heliocare can be purchased at [employer], at some [facility] or at [email or web address].    Recommend daily broad spectrum sunscreen SPF 30+ to sun-exposed areas, reapply every 2 hours as needed. Call for new or changing lesions.  Staying in the shade or wearing long sleeves, sun glasses (UVA+UVB protection) and wide brim hats (4-inch brim around the entire circumference of the hat) are also recommended for sun protection.   If You Need Anything After Your Visit  If you have any questions or concerns for your doctor, please call our main line at [phone] and press option 4 to reach your doctor's medical assistant. If no one answers, please leave a voicemail as directed and we will return your call as soon as possible. Messages left after 4 pm will be answered the following business day.   You may also send [place] a message via [place]. We typically respond to MyChart messages within 1-2 business days.  For prescription refills, please ask your pharmacy to contact our office. Our fax number is [phone].  If you have an urgent issue when the clinic is closed that cannot wait until the next business day, you can page your doctor at the number below.    Please note that while we do our best to be available for urgent issues outside of office hours, we are not available 24/7.   If you have an urgent issue and are unable to reach [place], you may choose to seek medical care at your doctor's office, retail clinic,  urgent care center, or emergency room.  If you have a medical emergency, please immediately call 911 or go to the emergency department.  Pager Numbers  - Dr. Nehemiah Massed: [phone]  - Dr. Laurence Ferrari: [phone]  - Dr. Nicole Kindred: [phone]  In the event of inclement weather, please call our main line at [phone] for an update on the status of any delays or closures.  Dermatology Medication Tips: Please keep the boxes that topical medications come in in order to help keep track of the instructions about where and how to use these. Pharmacies typically print the medication instructions only on the boxes and not directly on the medication tubes.   If your medication is too expensive, please contact our office at [phone] option 4 or send [place] a message through [place].   We are unable to tell what your co-pay for medications will be in advance as this is different depending on your insurance coverage. However, we may be able to find a substitute medication at lower cost or fill out paperwork to get insurance to cover a needed medication.   If a prior authorization is required to get your medication covered by your insurance company, please allow [place] 1-2 business days to complete this process.  Drug prices often vary depending on where the prescription is filled and some pharmacies may offer cheaper prices.  The website www.goodrx.com contains coupons for medications through different pharmacies. The prices here do not account for what the cost may be with  help from insurance (it may be cheaper with your insurance), but the website can give you the price if you did not use any insurance.  - You can print the associated coupon and take it with your prescription to the pharmacy.  - You may also stop by our office during regular business hours and pick up a GoodRx coupon card.  - If you need your prescription sent electronically to a different pharmacy, notify our office through [facility] or by phone at [phone] option 4.     Si Usted Necesita Algo Despus de Su Visita  Tambin puede enviarnos un mensaje a travs de Pharmacist, community. Por lo general respondemos a los mensajes de MyChart en el transcurso de 1 a 2 das hbiles.  Para renovar recetas, por favor pida a su farmacia que se ponga en contacto con nuestra oficina. Harland Dingwall de fax es [place] [phone].  Si tiene un asunto urgente cuando la clnica est cerrada y que no puede esperar hasta el siguiente da hbil, puede llamar/localizar a su doctor(a) al nmero que aparece a continuacin.   Por favor, tenga en cuenta que aunque hacemos todo lo posible para estar disponibles para asuntos urgentes fuera del horario de [place], no estamos disponibles las 24 horas del da, los 7 das de la [place].   Si tiene un problema urgente y no puede comunicarse con nosotros, puede optar por buscar atencin mdica  en el consultorio de su doctor(a), en una clnica privada, en un centro de atencin urgente o en una sala de emergencias.  Si tiene Engineering geologist, por favor llame inmediatamente al 911 o vaya a la sala de emergencias.  Nmeros de bper  - Dr. Nehemiah Massed: [phone]  - Dra. Moye: [phone]  - Dra. Nicole Kindred: [phone]  En caso de inclemencias del [place], por favor llame a Johnsie Kindred principal al [phone] para una actualizacin sobre el [place] de cualquier retraso o cierre.  Consejos para la medicacin en dermatologa: Por favor, guarde las cajas en las que vienen los medicamentos de uso tpico para ayudarle a seguir las instrucciones sobre dnde y cmo usarlos. Las farmacias generalmente imprimen las instrucciones del medicamento slo en las cajas y no directamente en los tubos del [place].   Si su medicamento es muy caro, por favor, pngase en contacto con Zigmund Daniel llamando al [phone] y presione la opcin 4 o envenos un mensaje a travs de Pharmacist, community.   No podemos decirle cul  ser su copago por los medicamentos por adelantado ya que esto es diferente dependiendo de la cobertura de su seguro. Sin embargo, es posible que podamos encontrar un medicamento sustituto a Electrical engineer un formulario para que el seguro cubra el medicamento que se considera necesario.   Si se requiere una autorizacin previa para que su compaa de seguros [place] su medicamento, por favor permtanos de 1 a 2 das hbiles para completar este proceso.  Los precios de los medicamentos varan con frecuencia dependiendo del Environmental consultant de dnde se surte la receta y alguna farmacias pueden ofrecer precios ms baratos.  El sitio web www.goodrx.com tiene cupones para medicamentos de Airline pilot. Los precios aqu no tienen en cuenta lo que podra costar con la ayuda del seguro (puede ser ms barato con su seguro), pero el sitio web puede darle el precio si no utiliz Research scientist (physical sciences).  - Puede imprimir el cupn correspondiente y llevarlo con su receta a la farmacia.  - Tambin puede pasar por nuestra oficina durante el  horario de Freight forwarder regular y Charity fundraiser una tarjeta de cupones de GoodRx.  - Si necesita que su receta se enve electrnicamente a una farmacia diferente, informe a nuestra oficina a travs de MyChart de [place] o por telfono llamando al [phone] y presione la opcin 4.

## 2021-04-30 ENCOUNTER — Encounter: Payer: Self-pay | Admitting: Dermatology

## 2021-10-17 ENCOUNTER — Other Ambulatory Visit: Payer: Self-pay | Admitting: Family Medicine

## 2021-10-17 DIAGNOSIS — Z1231 Encounter for screening mammogram for malignant neoplasm of breast: Secondary | ICD-10-CM

## 2021-11-21 ENCOUNTER — Encounter: Payer: Self-pay | Admitting: Dermatology

## 2021-11-21 ENCOUNTER — Ambulatory Visit: Payer: Medicare PPO | Admitting: Dermatology

## 2021-11-21 DIAGNOSIS — R238 Other skin changes: Secondary | ICD-10-CM | POA: Diagnosis not present

## 2021-11-21 DIAGNOSIS — L820 Inflamed seborrheic keratosis: Secondary | ICD-10-CM | POA: Diagnosis not present

## 2021-11-21 DIAGNOSIS — L578 Other skin changes due to chronic exposure to nonionizing radiation: Secondary | ICD-10-CM

## 2021-11-21 DIAGNOSIS — D1801 Hemangioma of skin and subcutaneous tissue: Secondary | ICD-10-CM

## 2021-11-21 DIAGNOSIS — L814 Other melanin hyperpigmentation: Secondary | ICD-10-CM | POA: Diagnosis not present

## 2021-11-21 DIAGNOSIS — Z1283 Encounter for screening for malignant neoplasm of skin: Secondary | ICD-10-CM

## 2021-11-21 DIAGNOSIS — L309 Dermatitis, unspecified: Secondary | ICD-10-CM

## 2021-11-21 DIAGNOSIS — L821 Other seborrheic keratosis: Secondary | ICD-10-CM

## 2021-11-21 DIAGNOSIS — D229 Melanocytic nevi, unspecified: Secondary | ICD-10-CM

## 2021-11-21 DIAGNOSIS — L918 Other hypertrophic disorders of the skin: Secondary | ICD-10-CM

## 2021-11-21 MED ORDER — EUCRISA 2 % EX OINT: TOPICAL_OINTMENT | CUTANEOUS | 3 refills | Status: DC

## 2021-11-21 NOTE — Progress Notes (Signed)
Follow-Up Visit   Subjective  Rebecca Griffith is a 66 y.o. female who presents for the following: Annual Exam (Mole check). Patient c/o flare of her hand dermatitis on her hands, Eucrisa ointment help when she uses it.  The patient presents for Total-Body Skin Exam (TBSE) for skin cancer screening and mole check.  The patient has spots, moles and lesions to be evaluated, some may be new or changing and the patient has concerns that these could be cancer.   The following portions of the chart were reviewed this encounter and updated as appropriate:   Tobacco  Allergies  Meds  Problems  Med Hx  Surg Hx  Fam Hx     Review of Systems:  No other skin or systemic complaints except as noted in HPI or Assessment and Plan.  Objective  Well appearing patient in no apparent distress; mood and affect are within normal limits.  A full examination was performed including scalp, head, eyes, ears, nose, lips, neck, chest, axillae, abdomen, back, buttocks, bilateral upper extremities, bilateral lower extremities, hands, feet, fingers, toes, fingernails, and toenails. All findings within normal limits unless otherwise noted below.  Right Hand - Anterior Mild pink patch on the right hand  Right Ear inferior helix Venous lake   right temple x 1 Stuck-on, waxy, tan-brown papule -- Discussed benign etiology and prognosis.    Assessment & Plan  Hand dermatitis Right Hand - Anterior  Chronic and persistent condition with duration or expected duration over one year. Condition is bothersome/symptomatic for patient. Currently flared.   hand dermatitis (eczema) is a chronic, relapsing, pruritic condition that can significantly affect quality of life. It is often associated with allergic rhinitis and/or asthma and can require treatment with topical medications, phototherapy, or in severe cases biologic injectable medication (Dupixent; Adbry) or Oral JAK inhibitors.   Restart Eucrisa ointment apply to  hands qd  Discussed other treatment options such as Opzelura.  Patient will stick with Eucrisa at this time.  Related Medications Crisaborole (EUCRISA) 2 % OINT Apply to hands qd  Venous lake Right Ear inferior helix  Benign-appearing.  Observation.  Call clinic for new or changing moles.  Recommend daily use of broad spectrum spf 30+ sunscreen to sun-exposed areas.    Inflamed seborrheic keratosis right temple x 1  Symptomatic, irritating, patient would like treated.   Destruction of lesion - right temple x 1 Complexity: simple   Destruction method: cryotherapy   Informed consent: discussed and consent obtained   Timeout:  patient name, date of birth, surgical site, and procedure verified Lesion destroyed using liquid nitrogen: Yes   Region frozen until ice ball extended beyond lesion: Yes   Outcome: patient tolerated procedure well with no complications   Post-procedure details: wound care instructions given    Lentigines - Scattered tan macules - Due to sun exposure - Benign-appearing, observe - Recommend daily broad spectrum sunscreen SPF 30+ to sun-exposed areas, reapply every 2 hours as needed. - Call for any changes  Seborrheic Keratoses - Stuck-on, waxy, tan-brown papules and/or plaques  - Benign-appearing - Discussed benign etiology and prognosis. - Observe - Call for any changes  Melanocytic Nevi - Tan-brown and/or pink-flesh-colored symmetric macules and papules - Benign appearing on exam today - Observation - Call clinic for new or changing moles - Recommend daily use of broad spectrum spf 30+ sunscreen to sun-exposed areas.   Hemangiomas - Red papules - Discussed benign nature - Observe - Call for any changes  Actinic  Damage - Chronic condition, secondary to cumulative UV/sun exposure - diffuse scaly erythematous macules with underlying dyspigmentation - Recommend daily broad spectrum sunscreen SPF 30+ to sun-exposed areas, reapply every 2 hours  as needed.  - Staying in the shade or wearing long sleeves, sun glasses (UVA+UVB protection) and wide brim hats (4-inch brim around the entire circumference of the hat) are also recommended for sun protection.  - Call for new or changing lesions.  Acrochordons (Skin Tags) - Removal desired by patient - Fleshy, skin-colored pedunculated papules - Benign appearing.  - Patient desires removal. Reviewed that this is not covered by insurance and they will be charged a cosmetic fee for removal. Patient signed non-covered consent.  - Prior to procedure, discussed risks of blister formation, small wound, skin dyspigmentation, or rare scar following cryotherapy.  PROCEDURE - Cryotherapy was performed to affected areas. The procedure was tolerated well. Wound care was reviewed with the patient. They were advised to call with any concerns. Total number of treated acrochordons 1  Left inframammary x 1   Skin cancer screening performed today.   Return in about 1 year (around 11/22/2022) for TBSE.  IMarye Round, CMA, am acting as scribe for [name], MD .  Documentation: I have reviewed the above documentation for accuracy and completeness, and I agree with the above.  [name], MD

## 2021-11-21 NOTE — Patient Instructions (Addendum)
Cryotherapy Aftercare  Wash gently with soap and water everyday.   Apply Vaseline and Band-Aid daily until healed.     Due to recent changes in healthcare laws, you may see results of your pathology and/or laboratory studies on MyChart before the doctors have had a chance to review them. We understand that in some cases there may be results that are confusing or concerning to you. Please understand that not all results are received at the same time and often the doctors may need to interpret multiple results in order to provide you with the best plan of care or course of treatment. Therefore, we ask that you please give us 2 business days to thoroughly review all your results before contacting the office for clarification. Should we see a critical lab result, you will be contacted sooner.   If You Need Anything After Your Visit  If you have any questions or concerns for your doctor, please call our main line at [phone] and press option 4 to reach your doctor's medical assistant. If no one answers, please leave a voicemail as directed and we will return your call as soon as possible. Messages left after 4 pm will be answered the following business day.   You may also send us a message via MyChart. We typically respond to MyChart messages within 1-2 business days.  For prescription refills, please ask your pharmacy to contact our office. Our fax number is [phone].  If you have an urgent issue when the clinic is closed that cannot wait until the next business day, you can page your doctor at the number below.    Please note that while we do our best to be available for urgent issues outside of office hours, we are not available 24/7.   If you have an urgent issue and are unable to reach us, you may choose to seek medical care at your doctor's office, retail clinic, urgent care center, or emergency room.  If you have a medical emergency, please immediately call 911 or go to the  emergency department.  Pager Numbers  - Dr. Kowalski: [phone]  - Dr. Moye: [phone]  - Dr. Stewart: [phone]  In the event of inclement weather, please call our main line at [phone] for an update on the status of any delays or closures.  Dermatology Medication Tips: Please keep the boxes that topical medications come in in order to help keep track of the instructions about where and how to use these. Pharmacies typically print the medication instructions only on the boxes and not directly on the medication tubes.   If your medication is too expensive, please contact our office at [phone] option 4 or send us a message through MyChart.   We are unable to tell what your co-pay for medications will be in advance as this is different depending on your insurance coverage. However, we may be able to find a substitute medication at lower cost or fill out paperwork to get insurance to cover a needed medication.   If a prior authorization is required to get your medication covered by your insurance company, please allow us 1-2 business days to complete this process.  Drug prices often vary depending on where the prescription is filled and some pharmacies may offer cheaper prices.  The website www.goodrx.com contains coupons for medications through different pharmacies. The prices here do not account for what the cost may be with help from insurance (it may be cheaper with your insurance), but the website can   give you the price if you did not use any insurance.  - You can print the associated coupon and take it with your prescription to the pharmacy.  - You may also stop by our office during regular business hours and pick up a GoodRx coupon card.  - If you need your prescription sent electronically to a different pharmacy, notify our office through [facility] MyChart or by phone at [phone] option 4.     Si Usted Necesita Algo Despus de Su Visita  Tambin puede  enviarnos un mensaje a travs de MyChart. Por lo general respondemos a los mensajes de MyChart en el transcurso de 1 a 2 das hbiles.  Para renovar recetas, por favor pida a su farmacia que se ponga en contacto con nuestra oficina. Nuestro nmero de fax es el [phone].  Si tiene un asunto urgente cuando la clnica est cerrada y que no puede esperar hasta el siguiente da hbil, puede llamar/localizar a su doctor(a) al nmero que aparece a continuacin.   Por favor, tenga en cuenta que aunque hacemos todo lo posible para estar disponibles para asuntos urgentes fuera del horario de oficina, no estamos disponibles las 24 horas del da, los 7 das de la semana.   Si tiene un problema urgente y no puede comunicarse con nosotros, puede optar por buscar atencin mdica  en el consultorio de su doctor(a), en una clnica privada, en un centro de atencin urgente o en una sala de emergencias.  Si tiene una emergencia mdica, por favor llame inmediatamente al 911 o vaya a la sala de emergencias.  Nmeros de bper  - Dr. Kowalski: [phone]  - Dra. Moye: [phone]  - Dra. Stewart: [phone]  En caso de inclemencias del tiempo, por favor llame a nuestra lnea principal al [phone] para una actualizacin sobre el estado de cualquier retraso o cierre.  Consejos para la medicacin en dermatologa: Por favor, guarde las cajas en las que vienen los medicamentos de uso tpico para ayudarle a seguir las instrucciones sobre dnde y cmo usarlos. Las farmacias generalmente imprimen las instrucciones del medicamento slo en las cajas y no directamente en los tubos del medicamento.   Si su medicamento es muy caro, por favor, pngase en contacto con nuestra oficina llamando al [phone] y presione la opcin 4 o envenos un mensaje a travs de MyChart.   No podemos decirle cul ser su copago por los medicamentos por adelantado ya que esto es diferente dependiendo de la cobertura de su seguro.  Sin embargo, es posible que podamos encontrar un medicamento sustituto a menor costo o llenar un formulario para que el seguro cubra el medicamento que se considera necesario.   Si se requiere una autorizacin previa para que su compaa de seguros cubra su medicamento, por favor permtanos de 1 a 2 das hbiles para completar este proceso.  Los precios de los medicamentos varan con frecuencia dependiendo del lugar de dnde se surte la receta y alguna farmacias pueden ofrecer precios ms baratos.  El sitio web www.goodrx.com tiene cupones para medicamentos de diferentes farmacias. Los precios aqu no tienen en cuenta lo que podra costar con la ayuda del seguro (puede ser ms barato con su seguro), pero el sitio web puede darle el precio si no utiliz ningn seguro.  - Puede imprimir el cupn correspondiente y llevarlo con su receta a la farmacia.  - Tambin puede pasar por nuestra oficina durante el horario de atencin regular y recoger una tarjeta de cupones de GoodRx.  -   Si necesita que su receta se enve electrnicamente a una farmacia diferente, informe a nuestra oficina a travs de MyChart de [place] o por telfono llamando al [phone] y presione la opcin 4.  

## 2021-12-11 ENCOUNTER — Other Ambulatory Visit: Payer: Self-pay

## 2021-12-11 DIAGNOSIS — L309 Dermatitis, unspecified: Secondary | ICD-10-CM

## 2021-12-11 MED ORDER — EUCRISA 2 % EX OINT: TOPICAL_OINTMENT | CUTANEOUS | 1 refills | Status: DC

## 2021-12-11 NOTE — Progress Notes (Signed)
Patient called requesting 90 day supply of Eucrisa from mail in pharmacy. RX sent in. aw

## 2021-12-24 ENCOUNTER — Ambulatory Visit
Admission: RE | Admit: 2021-12-24 | Discharge: 2021-12-24 | Disposition: A | Discharge status: Home / Self Care | Payer: Medicare PPO | Source: Ambulatory Visit | Attending: Family Medicine | Admitting: Family Medicine

## 2021-12-24 DIAGNOSIS — Z1231 Encounter for screening mammogram for malignant neoplasm of breast: Secondary | ICD-10-CM | POA: Insufficient documentation

## 2022-10-07 ENCOUNTER — Other Ambulatory Visit: Payer: Self-pay | Admitting: Family Medicine

## 2022-10-07 DIAGNOSIS — Z1231 Encounter for screening mammogram for malignant neoplasm of breast: Secondary | ICD-10-CM

## 2022-12-12 ENCOUNTER — Ambulatory Visit: Payer: Medicare PPO | Admitting: Dermatology

## 2022-12-12 VITALS — BP 154/90 | HR 65

## 2022-12-12 DIAGNOSIS — L821 Other seborrheic keratosis: Secondary | ICD-10-CM

## 2022-12-12 DIAGNOSIS — Z1283 Encounter for screening for malignant neoplasm of skin: Secondary | ICD-10-CM | POA: Diagnosis not present

## 2022-12-12 DIAGNOSIS — D492 Neoplasm of unspecified behavior of bone, soft tissue, and skin: Secondary | ICD-10-CM | POA: Diagnosis not present

## 2022-12-12 DIAGNOSIS — L309 Dermatitis, unspecified: Secondary | ICD-10-CM

## 2022-12-12 DIAGNOSIS — L814 Other melanin hyperpigmentation: Secondary | ICD-10-CM

## 2022-12-12 DIAGNOSIS — W908XXA Exposure to other nonionizing radiation, initial encounter: Secondary | ICD-10-CM

## 2022-12-12 DIAGNOSIS — Z7189 Other specified counseling: Secondary | ICD-10-CM

## 2022-12-12 DIAGNOSIS — I878 Other specified disorders of veins: Secondary | ICD-10-CM | POA: Diagnosis not present

## 2022-12-12 DIAGNOSIS — D489 Neoplasm of uncertain behavior, unspecified: Secondary | ICD-10-CM

## 2022-12-12 DIAGNOSIS — Z79899 Other long term (current) drug therapy: Secondary | ICD-10-CM

## 2022-12-12 DIAGNOSIS — D229 Melanocytic nevi, unspecified: Secondary | ICD-10-CM

## 2022-12-12 DIAGNOSIS — D1801 Hemangioma of skin and subcutaneous tissue: Secondary | ICD-10-CM

## 2022-12-12 DIAGNOSIS — L578 Other skin changes due to chronic exposure to nonionizing radiation: Secondary | ICD-10-CM

## 2022-12-12 DIAGNOSIS — L2089 Other atopic dermatitis: Secondary | ICD-10-CM

## 2022-12-12 MED ORDER — EUCRISA 2 % EX OINT: TOPICAL_OINTMENT | CUTANEOUS | 1 refills | Status: DC

## 2022-12-12 NOTE — Patient Instructions (Addendum)
Can use Eucrisa ointment on areas at knees if needed.     Biopsy Wound Care Instructions  Leave the original bandage on for 24 hours if possible.  If the bandage becomes soaked or soiled before that time, it is OK to remove it and examine the wound.  A small amount of post-operative bleeding is normal.  If excessive bleeding occurs, remove the bandage, place gauze over the site and apply continuous pressure (no peeking) over the area for 30 minutes. If this does not work, please call our clinic as soon as possible or page your doctor if it is after hours.   Once a day, cleanse the wound with soap and water. It is fine to shower. If a thick crust develops you may use a Q-tip dipped into dilute hydrogen peroxide (mix 1:1 with water) to dissolve it.  Hydrogen peroxide can slow the healing process, so use it only as needed.    After washing, apply petroleum jelly (Vaseline) or an antibiotic ointment if your doctor prescribed one for you, followed by a bandage.    For best healing, the wound should be covered with a layer of ointment at all times. If you are not able to keep the area covered with a bandage to hold the ointment in place, this may mean re-applying the ointment several times a day.  Continue this wound care until the wound has healed and is no longer open.   Itching and mild discomfort is normal during the healing process. However, if you develop pain or severe itching, please call our office.   If you have any discomfort, you can take Tylenol (acetaminophen) or ibuprofen as directed on the bottle. (Please do not take these if you have an allergy to them or cannot take them for another reason).  Some redness, tenderness and white or yellow material in the wound is normal healing.  If the area becomes very sore and red, or develops a thick yellow-green material (pus), it may be infected; please notify us.    If you have stitches, return to clinic as directed to have the stitches removed.  You will continue wound care for 2-3 days after the stitches are removed.   Wound healing continues for up to one year following surgery. It is not unusual to experience pain in the scar from time to time during the interval.  If the pain becomes severe or the scar thickens, you should notify the office.    A slight amount of redness in a scar is expected for the first six months.  After six months, the redness will fade and the scar will soften and fade.  The color difference becomes less noticeable with time.  If there are any problems, return for a post-op surgery check at your earliest convenience.  To improve the appearance of the scar, you can use silicone scar gel, cream, or sheets (such as Mederma or Serica) every night for up to one year. These are available over the counter (without a prescription).  Please call our office at [phone] for any questions or concerns.     Melanoma ABCDEs  Melanoma is the most dangerous type of skin cancer, and is the leading cause of death from skin disease.  You are more likely to develop melanoma if you: Have light-colored skin, light-colored eyes, or red or blond hair Spend a lot of time in the sun Tan regularly, either outdoors or in a tanning bed Have had blistering sunburns, especially during childhood Have  a close family member who has had a melanoma Have atypical moles or large birthmarks  Early detection of melanoma is key since treatment is typically straightforward and cure rates are extremely high if we catch it early.   The first sign of melanoma is often a change in a mole or a new dark spot.  The ABCDE system is a way of remembering the signs of melanoma.  A for asymmetry:  The two halves do not match. B for border:  The edges of the growth are irregular. C for color:  A mixture of colors are present instead of an even brown color. D for diameter:  Melanomas are usually (but not always) greater than 6mm - the size of a pencil  eraser. E for evolution:  The spot keeps changing in size, shape, and color.  Please check your skin once per month between visits. You can use a small mirror in front and a large mirror behind you to keep an eye on the back side or your body.   If you see any new or changing lesions before your next follow-up, please call to schedule a visit.  Please continue daily skin protection including broad spectrum sunscreen SPF 30+ to sun-exposed areas, reapplying every 2 hours as needed when you're outdoors.   Staying in the shade or wearing long sleeves, sun glasses (UVA+UVB protection) and wide brim hats (4-inch brim around the entire circumference of the hat) are also recommended for sun protection.    Due to recent changes in healthcare laws, you may see results of your pathology and/or laboratory studies on MyChart before the doctors have had a chance to review them. We understand that in some cases there may be results that are confusing or concerning to you. Please understand that not all results are received at the same time and often the doctors may need to interpret multiple results in order to provide you with the best plan of care or course of treatment. Therefore, we ask that you please give [place] 2 business days to thoroughly review all your results before contacting the office for clarification. Should we see a critical lab result, you will be contacted sooner.   If You Need Anything After Your Visit  If you have any questions or concerns for your doctor, please call our main line at [phone] and press option 4 to reach your doctor's medical assistant. If no one answers, please leave a voicemail as directed and we will return your call as soon as possible. Messages left after 4 pm will be answered the following business day.   You may also send [place] a message via MyChart. We typically respond to MyChart messages within 1-2 business days.  For prescription refills, please ask your pharmacy  to contact our office. Our fax number is [phone].  If you have an urgent issue when the clinic is closed that cannot wait until the next business day, you can page your doctor at the number below.    Please note that while we do our best to be available for urgent issues outside of office hours, we are not available 24/7.   If you have an urgent issue and are unable to reach [place], you may choose to seek medical care at your doctor's office, retail clinic, urgent care center, or emergency room.  If you have a medical emergency, please immediately call 911 or go to the emergency department.  Pager Numbers  - Dr. Gwen Pounds: [phone]  - Dr. Roseanne Reno:  [phone]  - Dr. Katrinka Blazing: [phone]   In the event of inclement weather, please call our main line at [phone] for an update on the status of any delays or closures.  Dermatology Medication Tips: Please keep the boxes that topical medications come in in order to help keep track of the instructions about where and how to use these. Pharmacies typically print the medication instructions only on the boxes and not directly on the medication tubes.   If your medication is too expensive, please contact our office at [phone] option 4 or send [place] a message through MyChart.   We are unable to tell what your co-pay for medications will be in advance as this is different depending on your insurance coverage. However, we may be able to find a substitute medication at lower cost or fill out paperwork to get insurance to cover a needed medication.   If a prior authorization is required to get your medication covered by your insurance company, please allow [place] 1-2 business days to complete this process.  Drug prices often vary depending on where the prescription is filled and some pharmacies may offer cheaper prices.  The website www.goodrx.com contains coupons for medications through different pharmacies. The prices here do not account for  what the cost may be with help from insurance (it may be cheaper with your insurance), but the website can give you the price if you did not use any insurance.  - You can print the associated coupon and take it with your prescription to the pharmacy.  - You may also stop by our office during regular business hours and pick up a GoodRx coupon card.  - If you need your prescription sent electronically to a different pharmacy, notify our office through [facility] or by phone at [phone] option 4.     Si Usted Necesita Algo Despus de Su Visita  Tambin puede enviarnos un mensaje a travs de Clinical cytogeneticist. Por lo general respondemos a los mensajes de MyChart en el transcurso de 1 a 2 das hbiles.  Para renovar recetas, por favor pida a su farmacia que se ponga en contacto con nuestra oficina. Annie Sable de fax es [place] [phone].  Si tiene un asunto urgente cuando la clnica est cerrada y que no puede esperar hasta el siguiente da hbil, puede llamar/localizar a su doctor(a) al nmero que aparece a continuacin.   Por favor, tenga en cuenta que aunque hacemos todo lo posible para estar disponibles para asuntos urgentes fuera del horario de [place], no estamos disponibles las 24 horas del da, los 7 [address]  [address] de la [place].   Si tiene un problema urgente y no puede comunicarse con nosotros, puede optar por buscar atencin mdica  en el consultorio de su doctor(a), en una clnica privada, en un centro de atencin urgente o en una sala de emergencias.  Si tiene Engineer, drilling, por favor llame inmediatamente al 911 o vaya a la sala de emergencias.  Nmeros de bper  - Dr. Gwen Pounds: [phone]  - Dra. Roseanne Reno: [phone]  - Dr. Katrinka Blazing: [phone]   En caso de inclemencias del tiempo, por favor llame a Lacy Duverney principal al [phone] para una actualizacin sobre el [place] de cualquier retraso o cierre.  Consejos para la medicacin en dermatologa: Por favor, guarde  las cajas en las que vienen los medicamentos de uso tpico para ayudarle a seguir las instrucciones sobre dnde y cmo usarlos. Las farmacias generalmente imprimen las instrucciones del medicamento slo en las cajas y  no directamente en los tubos del medicamento.   Si su medicamento es muy caro, por favor, pngase en contacto con Rolm Gala llamando al [phone] y presione la opcin 4 o envenos un mensaje a travs de Clinical cytogeneticist.   No podemos decirle cul ser su copago por los medicamentos por adelantado ya que esto es diferente dependiendo de la cobertura de su seguro. Sin embargo, es posible que podamos encontrar un medicamento sustituto a Audiological scientist un formulario para que el seguro cubra el medicamento que se considera necesario.   Si se requiere una autorizacin previa para que su compaa de seguros [place] su medicamento, por favor permtanos de 1 a 2 das hbiles para completar [address]  Los precios de los medicamentos varan con frecuencia dependiendo del Environmental consultant de dnde se surte la receta y alguna farmacias pueden ofrecer precios ms baratos.  El sitio web www.goodrx.com tiene cupones para medicamentos de Health and safety inspector. Los precios aqu no tienen en cuenta lo que podra costar con la ayuda del seguro (puede ser ms barato con su seguro), pero el sitio web puede darle el precio si no utiliz Tourist information centre manager.  - Puede imprimir el cupn correspondiente y llevarlo con su receta a la farmacia.  - Tambin puede pasar por nuestra oficina durante el horario de atencin regular y Education officer, museum una tarjeta de cupones de GoodRx.  - Si necesita que su receta se enve electrnicamente a una farmacia diferente, informe a nuestra oficina a travs de MyChart de [place] o por telfono llamando al [phone] y presione la opcin 4.

## 2022-12-12 NOTE — Progress Notes (Signed)
Follow-Up Visit   Subjective  Rebecca Griffith is a 67 y.o. female who presents for the following: Skin Cancer Screening and Full Body Skin Exam Hx of hand dermatitis currently using eucrisa ointment to affected areas when needed for flares.   The patient presents for Total-Body Skin Exam (TBSE) for skin cancer screening and mole check. The patient has spots, moles and lesions to be evaluated, some may be new or changing and the patient may have concern these could be cancer.  The following portions of the chart were reviewed this encounter and updated as appropriate: medications, allergies, medical history  Review of Systems:  No other skin or systemic complaints except as noted in HPI or Assessment and Plan.  Objective  Well appearing patient in no apparent distress; mood and affect are within normal limits.  A full examination was performed including scalp, head, eyes, ears, nose, lips, neck, chest, axillae, abdomen, back, buttocks, bilateral upper extremities, bilateral lower extremities, hands, feet, fingers, toes, fingernails, and toenails. All findings within normal limits unless otherwise noted below.   Relevant physical exam findings are noted in the Assessment and Plan.  left chest 0.6 cm dark purple macule            Assessment & Plan   SKIN CANCER SCREENING PERFORMED TODAY.  ACTINIC DAMAGE - Chronic condition, secondary to cumulative UV/sun exposure - diffuse scaly erythematous macules with underlying dyspigmentation - Recommend daily broad spectrum sunscreen SPF 30+ to sun-exposed areas, reapply every 2 hours as needed.  - Staying in the shade or wearing long sleeves, sun glasses (UVA+UVB protection) and wide brim hats (4-inch brim around the entire circumference of the hat) are also recommended for sun protection.  - Call for new or changing lesions.  LENTIGINES, SEBORRHEIC KERATOSES, HEMANGIOMAS - Benign normal skin lesions - Benign-appearing - Call for any  changes  MELANOCYTIC NEVI - Tan-brown and/or pink-flesh-colored symmetric macules and papules - Benign appearing on exam today - Observation - Call clinic for new or changing moles - Recommend daily use of broad spectrum spf 30+ sunscreen to sun-exposed areas.   Venous lake Right Ear inferior helix Benign-appearing.  Observation.  Call clinic for new or changing moles.  Recommend daily use of broad spectrum spf 30+ sunscreen to sun-exposed areas.    H;yperkeratosis of knees  -  possible psoriasis  No treatment desired  Hand dermatitis Right Hand - Anterior Exam: clear at exam Chronic and persistent condition with duration or expected duration over one year. Condition is improving with treatment but not currently at goal. hand dermatitis (eczema) is a chronic, relapsing, pruritic condition that can significantly affect quality of life. It is often associated with allergic rhinitis and/or asthma and can require treatment with topical medications, phototherapy, or in severe cases biologic injectable medication (Dupixent; Adbry) or Oral JAK inhibitors.    Continue Eucrisa ointment apply to hands qd  Recommend gloves to protect hands  Recommend moisturizer qhs  Neoplasm of uncertain behavior left chest  Epidermal / dermal shaving  Lesion diameter (cm):  0.6 Informed consent: discussed and consent obtained   Timeout: patient name, date of birth, surgical site, and procedure verified   Procedure prep:  Patient was prepped and draped in usual sterile fashion Prep type:  Isopropyl alcohol Anesthesia: the lesion was anesthetized in a standard fashion   Anesthetic:  1% lidocaine w/ epinephrine 1-100,000 buffered w/ 8.4% NaHCO3 Instrument used: flexible razor blade   Hemostasis achieved with: pressure, aluminum chloride and electrodesiccation  Outcome: patient tolerated procedure well   Post-procedure details: sterile dressing applied and wound care instructions given   Dressing type:  bandage and petrolatum    Specimen 1 - Surgical pathology Differential Diagnosis: R/o hemangioma vs other   Check Margins: No  R/o hemangioma vs other   Hand dermatitis  Related Medications [place] (EUCRISA) 2 % OINT Apply to hands qd   Return in about 1 year (around 12/12/2023) for TBSE.  IAsher Muir, CMA, am acting as scribe for Armida Sans, MD.   Documentation: I have reviewed the above documentation for accuracy and completeness, and I agree with the above.  Armida Sans, MD

## 2022-12-16 LAB — SURGICAL PATHOLOGY

## 2022-12-17 ENCOUNTER — Telehealth: Payer: Self-pay

## 2022-12-17 ENCOUNTER — Encounter: Payer: Self-pay | Admitting: Dermatology

## 2022-12-17 NOTE — Telephone Encounter (Addendum)
Called and discussed results with patient. She verbalized understanding and denied further questions.   ----- Message from Rebecca Griffith sent at 12/17/2022  9:20 AM EDT ----- FINAL DIAGNOSIS        1. Skin, left chest :       VENOUS LAKE   Benign venous lake = dilated blood vessel No further treatment needed

## 2022-12-26 ENCOUNTER — Ambulatory Visit
Admission: RE | Admit: 2022-12-26 | Discharge: 2022-12-26 | Disposition: A | Discharge status: Home / Self Care | Payer: Medicare PPO | Source: Ambulatory Visit | Attending: Family Medicine | Admitting: Family Medicine

## 2022-12-26 DIAGNOSIS — Z1231 Encounter for screening mammogram for malignant neoplasm of breast: Secondary | ICD-10-CM | POA: Insufficient documentation

## 2023-09-09 IMAGING — MG MM DIGITAL SCREENING BILAT W/ TOMO AND CAD
6 of 10 series · 6 of 30 positions shown · non-contrast
Comparison: Previous exam(s).

CLINICAL DATA: Screening.

EXAM:
DIGITAL SCREENING BILATERAL MAMMOGRAM WITH TOMOSYNTHESIS AND CAD
TECHNIQUE: Bilateral screening digital craniocaudal and mediolateral oblique
mammograms were obtained. Bilateral screening digital breast
tomosynthesis was performed. The images were evaluated with
computer-aided detection.

[R CC synth-2D]
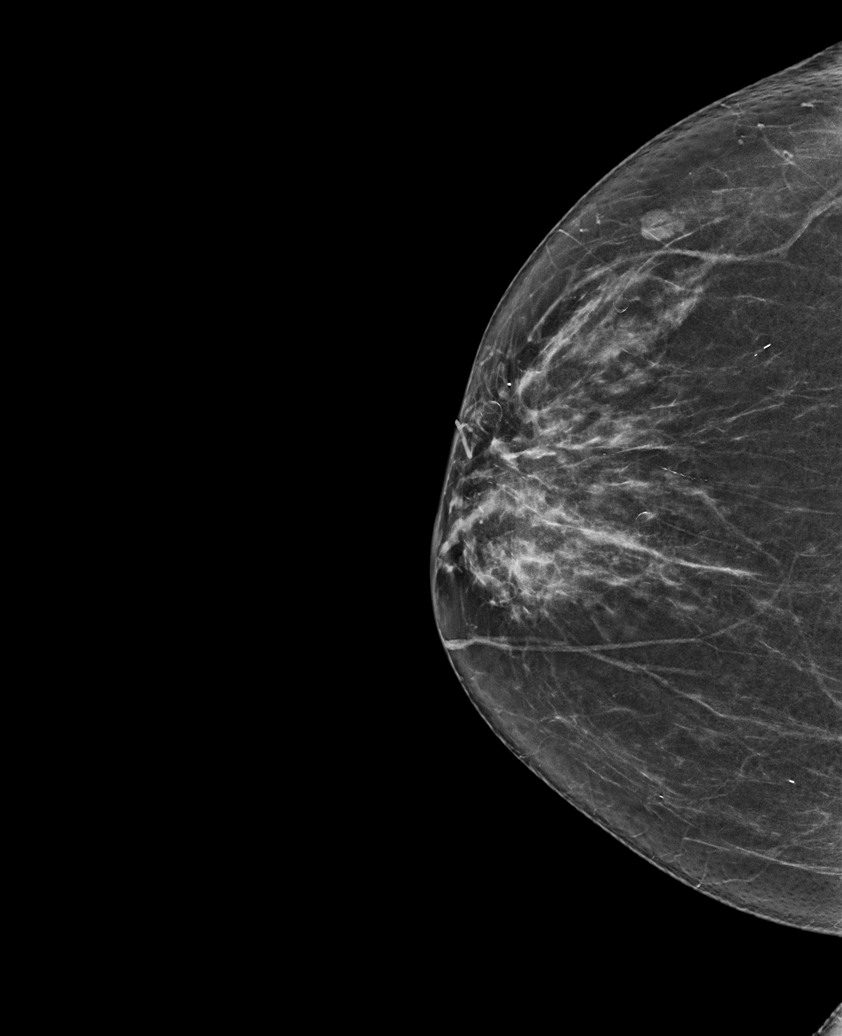

[R MLO synth-2D]
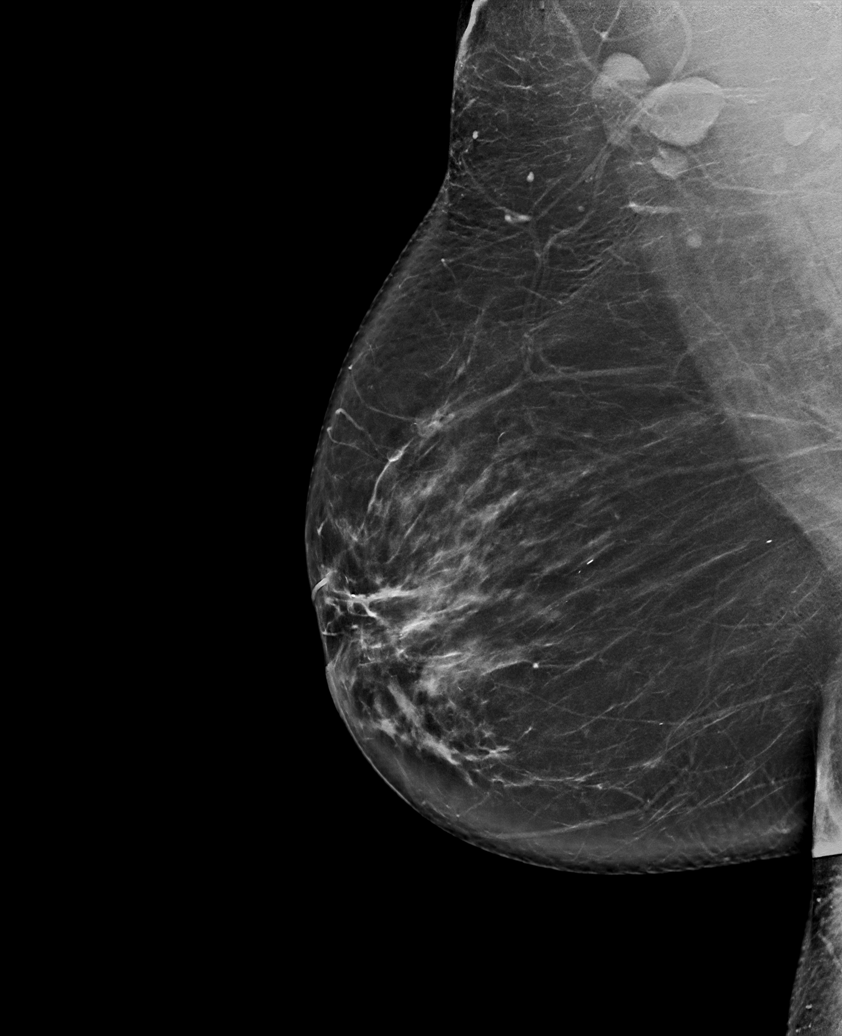

[L MLO synth-2D]
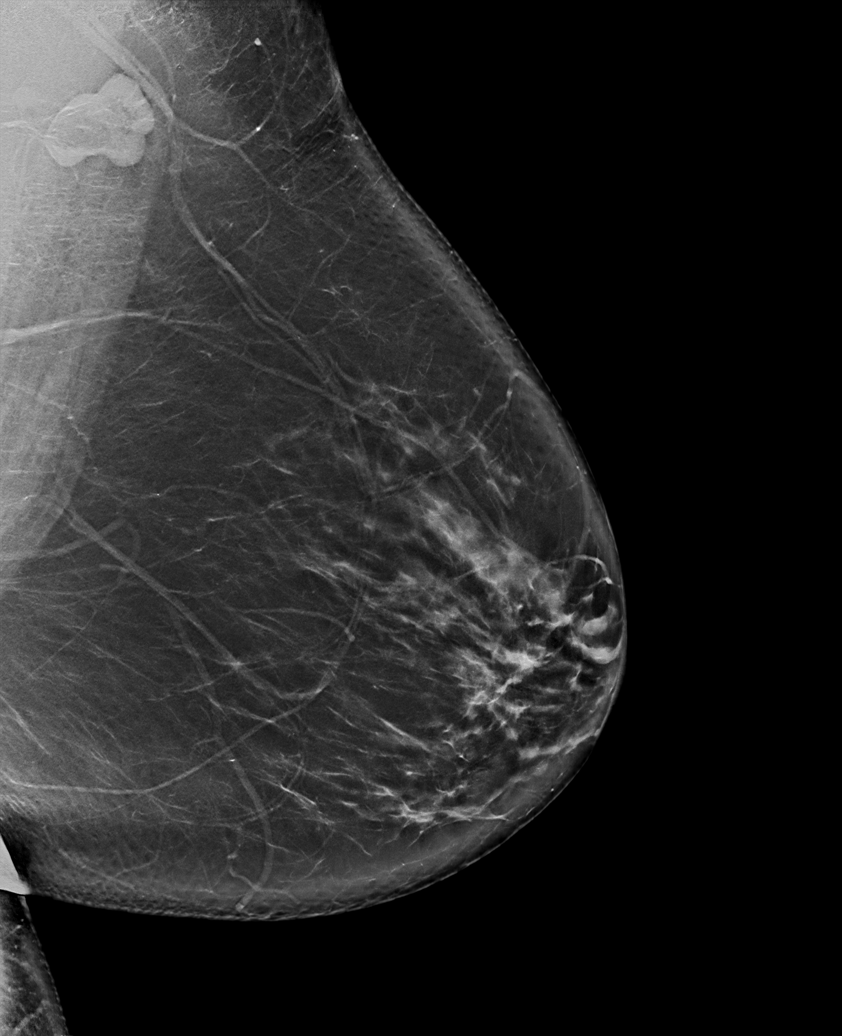

[R XCCL synth-2D]
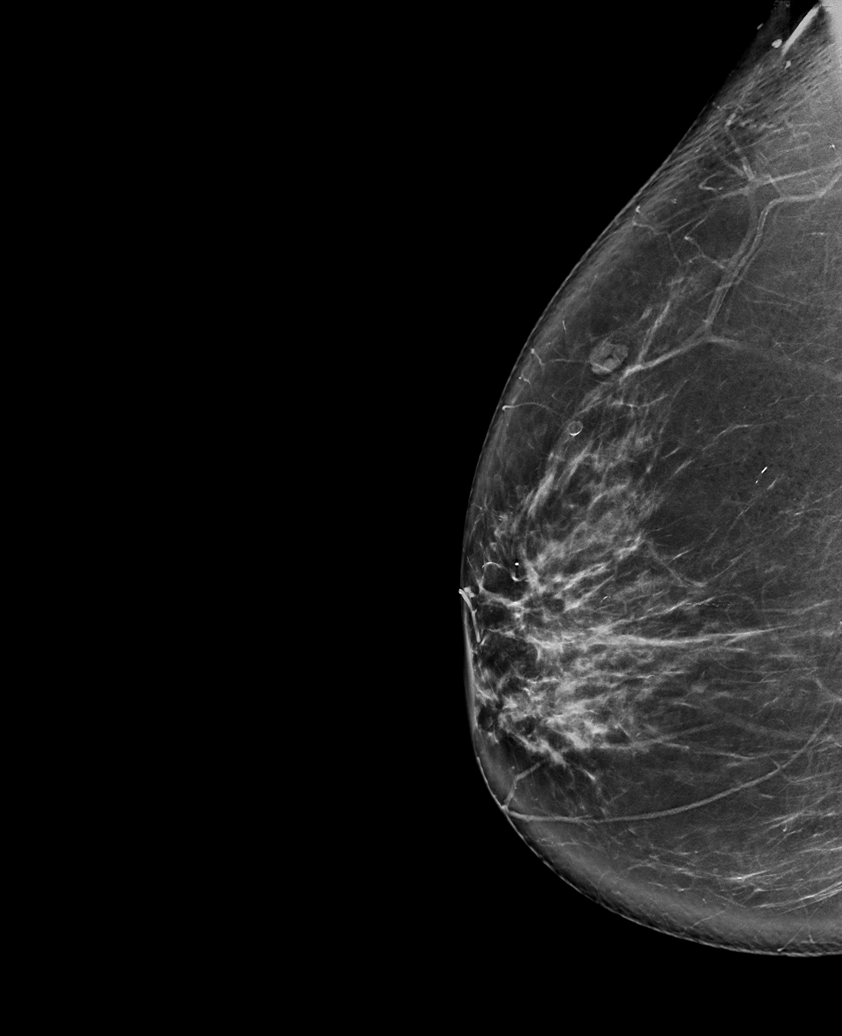

[L CC synth-2D]
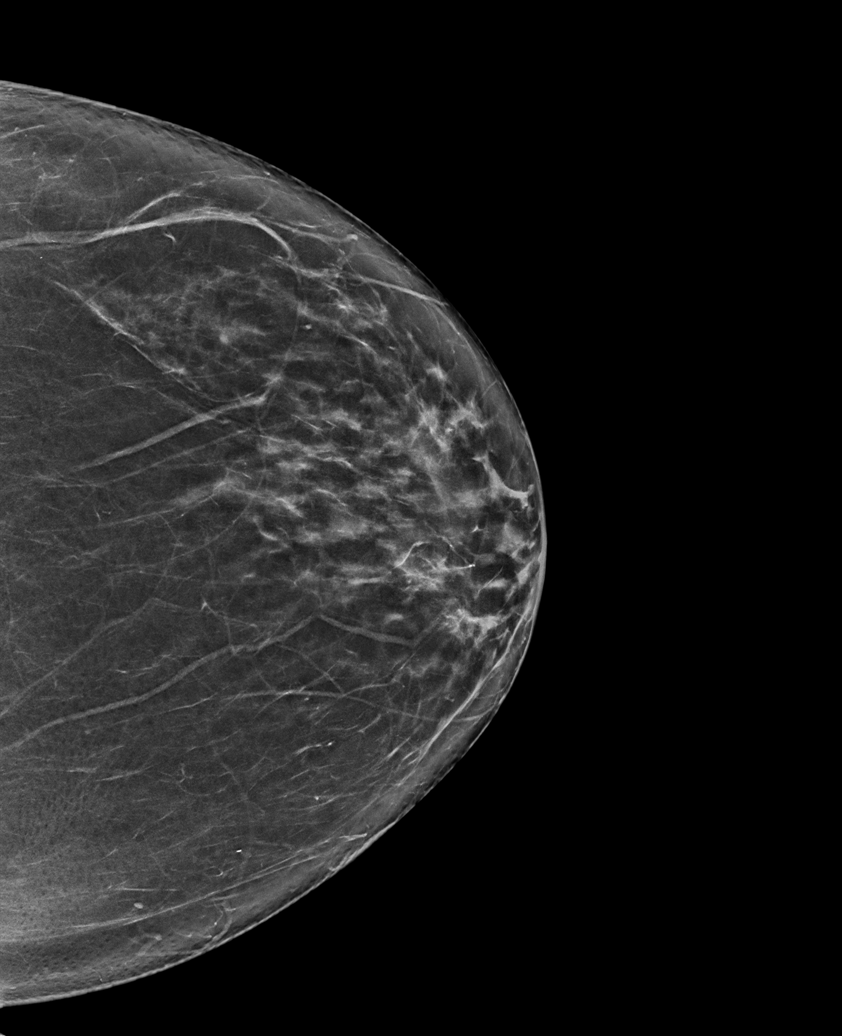

[R CC tomo · tomo slice 35/68.0]
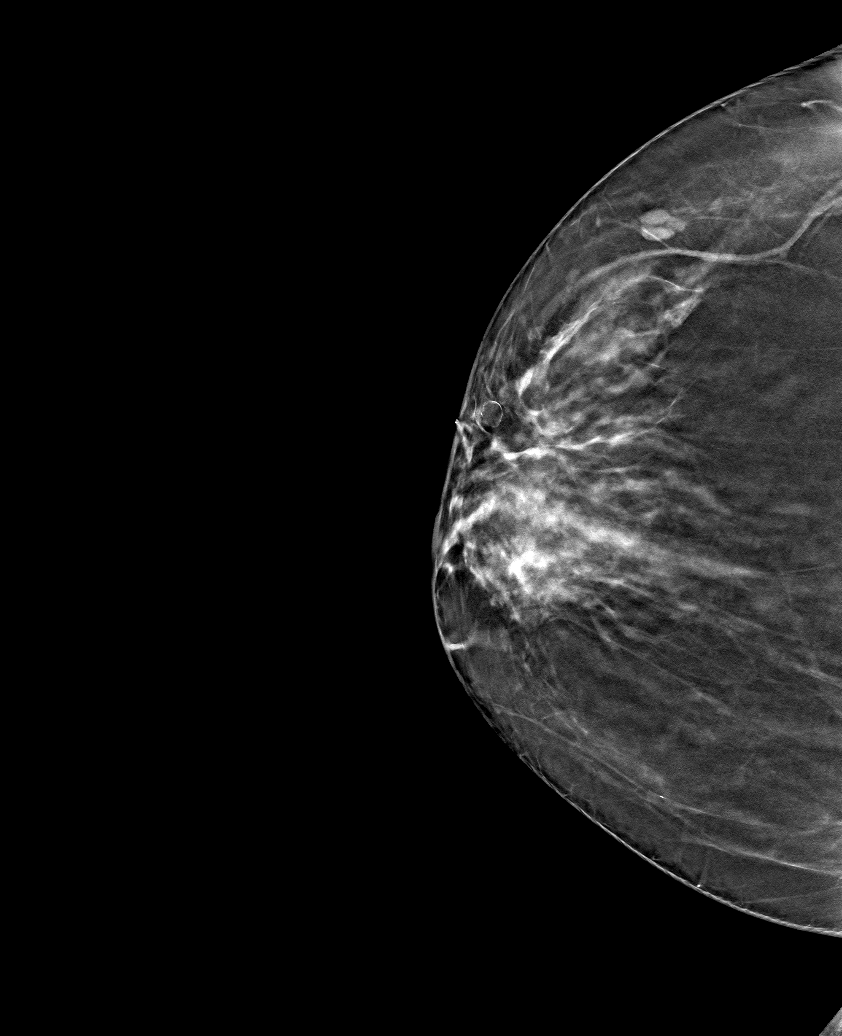

[6 of 30 positions shown; findings below may reference images not displayed]

ACR Breast Density Category b: There are scattered areas of
fibroglandular density.
FINDINGS: There are no findings suspicious for malignancy.
IMPRESSION: No mammographic evidence of malignancy. A result letter of this
screening mammogram will be mailed directly to the patient.

RECOMMENDATION:
Screening mammogram in one year. (Code:51-O-LD2)

BI-RADS CATEGORY  1: Negative.

## 2023-10-16 ENCOUNTER — Other Ambulatory Visit: Payer: Self-pay | Admitting: Family Medicine

## 2023-10-16 DIAGNOSIS — Z1231 Encounter for screening mammogram for malignant neoplasm of breast: Secondary | ICD-10-CM

## 2023-12-17 ENCOUNTER — Encounter: Payer: Self-pay | Admitting: Dermatology

## 2023-12-17 ENCOUNTER — Ambulatory Visit: Payer: Medicare PPO | Admitting: Dermatology

## 2023-12-17 DIAGNOSIS — Z7189 Other specified counseling: Secondary | ICD-10-CM

## 2023-12-17 DIAGNOSIS — W908XXA Exposure to other nonionizing radiation, initial encounter: Secondary | ICD-10-CM | POA: Diagnosis not present

## 2023-12-17 DIAGNOSIS — L821 Other seborrheic keratosis: Secondary | ICD-10-CM

## 2023-12-17 DIAGNOSIS — L814 Other melanin hyperpigmentation: Secondary | ICD-10-CM | POA: Diagnosis not present

## 2023-12-17 DIAGNOSIS — L918 Other hypertrophic disorders of the skin: Secondary | ICD-10-CM

## 2023-12-17 DIAGNOSIS — Z1283 Encounter for screening for malignant neoplasm of skin: Secondary | ICD-10-CM | POA: Diagnosis not present

## 2023-12-17 DIAGNOSIS — L308 Other specified dermatitis: Secondary | ICD-10-CM | POA: Diagnosis not present

## 2023-12-17 DIAGNOSIS — L578 Other skin changes due to chronic exposure to nonionizing radiation: Secondary | ICD-10-CM | POA: Diagnosis not present

## 2023-12-17 DIAGNOSIS — Z79899 Other long term (current) drug therapy: Secondary | ICD-10-CM

## 2023-12-17 DIAGNOSIS — D1801 Hemangioma of skin and subcutaneous tissue: Secondary | ICD-10-CM

## 2023-12-17 DIAGNOSIS — L309 Dermatitis, unspecified: Secondary | ICD-10-CM

## 2023-12-17 DIAGNOSIS — D229 Melanocytic nevi, unspecified: Secondary | ICD-10-CM

## 2023-12-17 DIAGNOSIS — L820 Inflamed seborrheic keratosis: Secondary | ICD-10-CM | POA: Diagnosis not present

## 2023-12-17 MED ORDER — EUCRISA 2 % EX OINT: TOPICAL_OINTMENT | CUTANEOUS | 3 refills | Status: AC

## 2023-12-17 NOTE — Patient Instructions (Addendum)
 Seborrheic Keratosis  What causes seborrheic keratoses? Seborrheic keratoses are harmless, common skin growths that first appear during adult life.  As time goes by, more growths appear.  Some people may develop a large number of them.  Seborrheic keratoses appear on both covered and uncovered body parts.  They are not caused by sunlight.  The tendency to develop seborrheic keratoses can be inherited.  They vary in color from skin-colored to gray, brown, or even black.  They can be either smooth or have a rough, warty surface.   Seborrheic keratoses are superficial and look as if they were stuck on the skin.  Under the microscope this type of keratosis looks like layers upon layers of skin.  That is why at times the top layer may seem to fall off, but the rest of the growth remains and re-grows.    Treatment Seborrheic keratoses do not need to be treated, but can easily be removed in the office.  Seborrheic keratoses often cause symptoms when they rub on clothing or jewelry.  Lesions can be in the way of shaving.  If they become inflamed, they can cause itching, soreness, or burning.  Removal of a seborrheic keratosis can be accomplished by freezing, burning, or surgery. If any spot bleeds, scabs, or grows rapidly, please return to have it checked, as these can be an indication of a skin cancer.   Cryotherapy Aftercare  Wash gently with soap and water everyday.   Apply Vaseline and Band-Aid daily until healed.     Melanoma ABCDEs  Melanoma is the most dangerous type of skin cancer, and is the leading cause of death from skin disease.  You are more likely to develop melanoma if you: Have light-colored skin, light-colored eyes, or red or blond hair Spend a lot of time in the sun Tan regularly, either outdoors or in a tanning bed Have had blistering sunburns, especially during childhood Have a close family member who has had a melanoma Have atypical moles or large birthmarks  Early detection  of melanoma is key since treatment is typically straightforward and cure rates are extremely high if we catch it early.   The first sign of melanoma is often a change in a mole or a new dark spot.  The ABCDE system is a way of remembering the signs of melanoma.  A for asymmetry:  The two halves do not match. B for border:  The edges of the growth are irregular. C for color:  A mixture of colors are present instead of an even brown color. D for diameter:  Melanomas are usually (but not always) greater than 6mm - the size of a pencil eraser. E for evolution:  The spot keeps changing in size, shape, and color.  Please check your skin once per month between visits. You can use a small mirror in front and a large mirror behind you to keep an eye on the back side or your body.   If you see any new or changing lesions before your next follow-up, please call to schedule a visit.  Please continue daily skin protection including broad spectrum sunscreen SPF 30+ to sun-exposed areas, reapplying every 2 hours as needed when you're outdoors.   Staying in the shade or wearing long sleeves, sun glasses (UVA+UVB protection) and wide brim hats (4-inch brim around the entire circumference of the hat) are also recommended for sun protection.     Due to recent changes in healthcare laws, you may see results of your  pathology and/or laboratory studies on MyChart before the doctors have had a chance to review them. We understand that in some cases there may be results that are confusing or concerning to you. Please understand that not all results are received at the same time and often the doctors may need to interpret multiple results in order to provide you with the best plan of care or course of treatment. Therefore, we ask that you please give us  2 business days to thoroughly review all your results before contacting the office for clarification. Should we see a critical lab result, you will be contacted  sooner.   If You Need Anything After Your Visit  If you have any questions or concerns for your doctor, please call our main line at [phone] and press option 4 to reach your doctor's medical assistant. If no one answers, please leave a voicemail as directed and we will return your call as soon as possible. Messages left after 4 pm will be answered the following business day.   You may also send us  a message via MyChart. We typically respond to MyChart messages within 1-2 business days.  For prescription refills, please ask your pharmacy to contact our office. Our fax number is [phone].  If you have an urgent issue when the clinic is closed that cannot wait until the next business day, you can page your doctor at the number below.    Please note that while we do our best to be available for urgent issues outside of office hours, we are not available 24/7.   If you have an urgent issue and are unable to reach us , you may choose to seek medical care at your doctor's office, retail clinic, urgent care center, or emergency room.  If you have a medical emergency, please immediately call 911 or go to the emergency department.  Pager Numbers  - Dr. Hester: [phone]  - Dr. Jackquline: [phone]  - Dr. Claudene: [phone]   - Dr. Raymund: [phone]  In the event of inclement weather, please call our main line at [phone] for an update on the status of any delays or closures.  Dermatology Medication Tips: Please keep the boxes that topical medications come in in order to help keep track of the instructions about where and how to use these. Pharmacies typically print the medication instructions only on the boxes and not directly on the medication tubes.   If your medication is too expensive, please contact our office at [phone] option 4 or send us  a message through MyChart.   We are unable to tell what your co-pay for medications will be in advance as this is  different depending on your insurance coverage. However, we may be able to find a substitute medication at lower cost or fill out paperwork to get insurance to cover a needed medication.   If a prior authorization is required to get your medication covered by your insurance company, please allow us  1-2 business days to complete this process.  Drug prices often vary depending on where the prescription is filled and some pharmacies may offer cheaper prices.  The website www.goodrx.com contains coupons for medications through different pharmacies. The prices here do not account for what the cost may be with help from insurance (it may be cheaper with your insurance), but the website can give you the price if you did not use any insurance.  - You can print the associated coupon and take it with your prescription to the pharmacy.  -  You may also stop by our office during regular business hours and pick up a GoodRx coupon card.  - If you need your prescription sent electronically to a different pharmacy, notify our office through [facility] or by phone at [phone] option 4.     Si Usted Necesita Algo Despus de Su Visita  Tambin puede enviarnos un mensaje a travs de Clinical cytogeneticist. Por lo general respondemos a los mensajes de MyChart en el transcurso de 1 a 2 das hbiles.  Para renovar recetas, por favor pida a su farmacia que se ponga en contacto con nuestra oficina. Randi lakes de fax es [place] [phone].  Si tiene un asunto urgente cuando la clnica est cerrada y que no puede esperar hasta el siguiente da hbil, puede llamar/localizar a su doctor(a) al nmero que aparece a continuacin.   Por favor, tenga en cuenta que aunque hacemos todo lo posible para estar disponibles para asuntos urgentes fuera del horario de [place], no estamos disponibles las 24 horas del da, los 7 [address]  [address] de la [place].   Si tiene un problema urgente y no puede comunicarse con nosotros, puede optar por buscar  atencin mdica  en el consultorio de su doctor(a), en una clnica privada, en un centro de atencin urgente o en una sala de emergencias.  Si tiene Engineer, drilling, por favor llame inmediatamente al 911 o vaya a la sala de emergencias.  Nmeros de bper  - Dr. Hester: [phone]  - Dra. Jackquline: [phone]  - Dr. Claudene: [phone]  - Dra. Kitts: [phone]  En caso de inclemencias del [place], por favor llame a nuestra lnea principal al [phone] para una actualizacin sobre el estado de cualquier retraso o cierre.  Consejos para la medicacin en dermatologa: Por favor, guarde las cajas en las que vienen los medicamentos de uso tpico para ayudarle a seguir las instrucciones sobre dnde y cmo usarlos. Las farmacias generalmente imprimen las instrucciones del medicamento slo en las cajas y no directamente en los tubos del [place].   Si su medicamento es muy caro, por favor, pngase en contacto con landry rieger llamando al [phone] y presione la opcin 4 o envenos un mensaje a travs de Clinical cytogeneticist.   No podemos decirle cul ser su copago por los medicamentos por adelantado ya que esto es diferente dependiendo de la cobertura de su seguro. Sin embargo, es posible que podamos encontrar un medicamento sustituto a Audiological scientist un formulario para que el seguro cubra el medicamento que se considera necesario.   Si se requiere una autorizacin previa para que su compaa de seguros malta su medicamento, por favor permtanos de 1 a 2 das hbiles para completar este proceso.  Los precios de los medicamentos varan con frecuencia dependiendo del Environmental consultant de dnde se surte la receta y alguna farmacias pueden ofrecer precios ms baratos.  El sitio web www.goodrx.com tiene cupones para medicamentos de Health and safety inspector. Los precios aqu no tienen en cuenta lo que podra costar con la ayuda del seguro (puede ser ms barato con su seguro), pero el sitio web puede  darle el precio si no utiliz Tourist information centre manager.  - Puede imprimir el cupn correspondiente y llevarlo con su receta a la farmacia.  - Tambin puede pasar por nuestra oficina durante el horario de atencin regular y Education officer, museum una tarjeta de cupones de GoodRx.  - Si necesita que su receta se enve electrnicamente a Psychiatrist, informe a nuestra oficina a travs de MyChart de [employer] o por  telfono llamando al [phone] y presione la opcin 4.

## 2023-12-17 NOTE — Progress Notes (Signed)
 Follow-Up Visit   Subjective  Rebecca Griffith is a 68 y.o. female who presents for the following: Skin Cancer Screening and Full Body Skin Exam Hx of Hand dermatitis using  eucrisa    The patient presents for Total-Body Skin Exam (TBSE) for skin cancer screening and mole check. The patient has spots, moles and lesions to be evaluated, some may be new or changing and the patient may have concern these could be cancer.  The following portions of the chart were reviewed this encounter and updated as appropriate: medications, allergies, medical history  Review of Systems:  No other skin or systemic complaints except as noted in HPI or Assessment and Plan.  Objective  Well appearing patient in no apparent distress; mood and affect are within normal limits.  A full examination was performed including scalp, head, eyes, ears, nose, lips, neck, chest, axillae, abdomen, back, buttocks, bilateral upper extremities, bilateral lower extremities, hands, feet, fingers, toes, fingernails, and toenails. All findings within normal limits unless otherwise noted below.   Relevant physical exam findings are noted in the Assessment and Plan.  left infraorbital x 1 Erythematous stuck-on, waxy papule or plaque  Assessment & Plan   SKIN CANCER SCREENING PERFORMED TODAY.  ACTINIC DAMAGE - Chronic condition, secondary to cumulative UV/sun exposure - diffuse scaly erythematous macules with underlying dyspigmentation - Recommend daily broad spectrum sunscreen SPF 30+ to sun-exposed areas, reapply every 2 hours as needed.  - Staying in the shade or wearing long sleeves, sun glasses (UVA+UVB protection) and wide brim hats (4-inch brim around the entire circumference of the hat) are also recommended for sun protection.  - Call for new or changing lesions.  LENTIGINES, SEBORRHEIC KERATOSES, HEMANGIOMAS - Benign normal skin lesions - Benign-appearing - Call for any changes  MELANOCYTIC NEVI - Tan-brown and/or  pink-flesh-colored symmetric macules and papules - Benign appearing on exam today - Observation - Call clinic for new or changing moles - Recommend daily use of broad spectrum spf 30+ sunscreen to sun-exposed areas.   [place] Exam: red or purple papule at right ear inferior helix   Treatment Plan: Benign-appearing.  Observation.  Call clinic for new or changing moles.  Recommend daily use of broad spectrum spf 30+ sunscreen to sun-exposed areas.    Acrochordons (Skin Tags) - Fleshy, skin-colored pedunculated papules - Benign appearing.  - Observe. - If desired, they can be removed with an in office procedure that is not covered by insurance. - Please call the clinic if you notice any new or changing lesions.    H;yperkeratosis of knees  -  possible psoriasis  No treatment desired   Hand dermatitis Right Hand - Anterior Exam: clear at exam Chronic and persistent condition with duration or expected duration over one year. Condition is improving with treatment but not currently at goal. hand dermatitis (eczema) is a chronic, relapsing, pruritic condition that can significantly affect quality of life. It is often associated with allergic rhinitis and/or asthma and can require treatment with topical medications, phototherapy, or in severe cases biologic injectable medication (Dupixent; Adbry) or Oral JAK inhibitors.    Continue Eucrisa  ointment apply to hands qd  180 g 3 rfs [facility] Will consider new medication Anzupgo 2 % cream in the future    INFLAMED SEBORRHEIC KERATOSIS left infraorbital x 1 Symptomatic, irritating, patient would like treated. Destruction of lesion - left infraorbital x 1 Complexity: simple   Destruction method: cryotherapy   Informed consent: discussed and consent obtained   Timeout:  patient name, date of birth, surgical site, and procedure verified Lesion destroyed using liquid nitrogen: Yes   Region frozen until ice ball extended beyond  lesion: Yes   Outcome: patient tolerated procedure well with no complications   Post-procedure details: wound care instructions given    HAND DERMATITIS   Related Medications Cris[place]  (EUCRISA ) 2 % OINT Apply to hands qd for hand eczema Return in about 1 year (around 12/16/2024) for TBSE.  IEleanor Blush, CMA, am acting as scribe for Alm Rhyme, MD.   Documentation: I have reviewed the above documentation for accuracy and completeness, and I agree with the above.  Alm Rhyme, MD

## 2023-12-29 ENCOUNTER — Ambulatory Visit
Admission: RE | Admit: 2023-12-29 | Discharge: 2023-12-29 | Disposition: A | Discharge status: Home / Self Care | Source: Ambulatory Visit | Attending: Family Medicine | Admitting: Family Medicine

## 2023-12-29 DIAGNOSIS — Z1231 Encounter for screening mammogram for malignant neoplasm of breast: Secondary | ICD-10-CM | POA: Diagnosis present

## 2024-12-22 ENCOUNTER — Ambulatory Visit: Admitting: Dermatology
